# Patient Record
Sex: Female | Born: 1980 | Race: Black or African American | Hispanic: No | Marital: Single | Smoking: Never smoker
Health system: Southern US, Community
[De-identification: ages and names within clinical notes are randomized; demographics above are authoritative.]

## PROBLEM LIST (undated history)

## (undated) DIAGNOSIS — N6310 Unspecified lump in the right breast, unspecified quadrant: Secondary | ICD-10-CM

## (undated) DIAGNOSIS — J45909 Unspecified asthma, uncomplicated: Secondary | ICD-10-CM

## (undated) DIAGNOSIS — Z8719 Personal history of other diseases of the digestive system: Secondary | ICD-10-CM

## (undated) DIAGNOSIS — F988 Other specified behavioral and emotional disorders with onset usually occurring in childhood and adolescence: Secondary | ICD-10-CM

## (undated) DIAGNOSIS — Z8041 Family history of malignant neoplasm of ovary: Secondary | ICD-10-CM

## (undated) DIAGNOSIS — L709 Acne, unspecified: Secondary | ICD-10-CM

## (undated) DIAGNOSIS — N926 Irregular menstruation, unspecified: Secondary | ICD-10-CM

## (undated) DIAGNOSIS — R011 Cardiac murmur, unspecified: Secondary | ICD-10-CM

## (undated) DIAGNOSIS — K589 Irritable bowel syndrome without diarrhea: Secondary | ICD-10-CM

## (undated) HISTORY — DX: Family history of malignant neoplasm of ovary: Z80.41

## (undated) HISTORY — DX: Cardiac murmur, unspecified: R01.1

## (undated) HISTORY — DX: Unspecified asthma, uncomplicated: J45.909

## (undated) HISTORY — PX: BREAST ENHANCEMENT SURGERY: SHX7

## (undated) HISTORY — DX: Other specified behavioral and emotional disorders with onset usually occurring in childhood and adolescence: F98.8

## (undated) HISTORY — DX: Personal history of other diseases of the digestive system: Z87.19

## (undated) HISTORY — DX: Irritable bowel syndrome, unspecified: K58.9

---

## 2000-01-20 HISTORY — PX: KNEE SURGERY: SHX244

## 2002-09-25 ENCOUNTER — Ambulatory Visit: Admission: RE | Admit: 2002-09-25 | Discharge: 2002-10-19 | Payer: Self-pay | Admitting: Radiation Oncology

## 2003-07-24 ENCOUNTER — Encounter: Admission: RE | Admit: 2003-07-24 | Discharge: 2003-07-24 | Payer: Self-pay | Admitting: Family Medicine

## 2003-09-02 ENCOUNTER — Other Ambulatory Visit: Admission: RE | Admit: 2003-09-02 | Discharge: 2003-09-02 | Payer: Self-pay | Admitting: Obstetrics and Gynecology

## 2003-09-02 ENCOUNTER — Encounter: Admission: RE | Admit: 2003-09-02 | Discharge: 2003-09-02 | Payer: Self-pay | Admitting: Obstetrics and Gynecology

## 2003-09-02 ENCOUNTER — Encounter (INDEPENDENT_AMBULATORY_CARE_PROVIDER_SITE_OTHER): Payer: Self-pay

## 2003-09-16 ENCOUNTER — Encounter: Admission: RE | Admit: 2003-09-16 | Discharge: 2003-09-16 | Payer: Self-pay | Admitting: Obstetrics and Gynecology

## 2004-06-22 ENCOUNTER — Encounter: Admission: RE | Admit: 2004-06-22 | Discharge: 2004-06-22 | Payer: Self-pay | Admitting: Obstetrics and Gynecology

## 2004-07-13 ENCOUNTER — Encounter: Admission: RE | Admit: 2004-07-13 | Discharge: 2004-07-13 | Payer: Self-pay | Admitting: Obstetrics and Gynecology

## 2004-07-13 ENCOUNTER — Encounter (INDEPENDENT_AMBULATORY_CARE_PROVIDER_SITE_OTHER): Payer: Self-pay | Admitting: Specialist

## 2004-08-10 ENCOUNTER — Encounter: Admission: RE | Admit: 2004-08-10 | Discharge: 2004-08-10 | Payer: Self-pay | Admitting: Obstetrics and Gynecology

## 2004-08-30 ENCOUNTER — Ambulatory Visit: Payer: Self-pay | Admitting: Obstetrics and Gynecology

## 2004-08-30 ENCOUNTER — Ambulatory Visit (HOSPITAL_COMMUNITY): Admission: RE | Admit: 2004-08-30 | Discharge: 2004-08-30 | Payer: Self-pay | Admitting: *Deleted

## 2004-08-30 ENCOUNTER — Encounter (INDEPENDENT_AMBULATORY_CARE_PROVIDER_SITE_OTHER): Payer: Self-pay | Admitting: Specialist

## 2004-08-30 HISTORY — PX: DILATION AND CURETTAGE OF UTERUS: SHX78

## 2004-08-31 ENCOUNTER — Ambulatory Visit: Payer: Self-pay | Admitting: Obstetrics and Gynecology

## 2004-10-12 ENCOUNTER — Other Ambulatory Visit: Admission: RE | Admit: 2004-10-12 | Discharge: 2004-10-12 | Payer: Self-pay | Admitting: Obstetrics and Gynecology

## 2004-10-12 ENCOUNTER — Ambulatory Visit: Payer: Self-pay | Admitting: Obstetrics and Gynecology

## 2005-04-11 ENCOUNTER — Ambulatory Visit (HOSPITAL_COMMUNITY): Admission: RE | Admit: 2005-04-11 | Discharge: 2005-04-11 | Payer: Self-pay | Admitting: Obstetrics & Gynecology

## 2005-05-23 ENCOUNTER — Ambulatory Visit (HOSPITAL_COMMUNITY): Admission: RE | Admit: 2005-05-23 | Discharge: 2005-05-23 | Payer: Self-pay | Admitting: Obstetrics & Gynecology

## 2005-08-29 ENCOUNTER — Inpatient Hospital Stay (HOSPITAL_COMMUNITY): Admission: AD | Admit: 2005-08-29 | Discharge: 2005-08-29 | Payer: Self-pay | Admitting: Obstetrics

## 2005-09-30 ENCOUNTER — Inpatient Hospital Stay (HOSPITAL_COMMUNITY): Admission: AD | Admit: 2005-09-30 | Discharge: 2005-10-01 | Payer: Self-pay | Admitting: Obstetrics

## 2005-10-03 ENCOUNTER — Inpatient Hospital Stay (HOSPITAL_COMMUNITY): Admission: AD | Admit: 2005-10-03 | Discharge: 2005-10-07 | Payer: Self-pay | Admitting: Obstetrics

## 2005-10-04 ENCOUNTER — Encounter (INDEPENDENT_AMBULATORY_CARE_PROVIDER_SITE_OTHER): Payer: Self-pay | Admitting: Specialist

## 2005-10-24 ENCOUNTER — Ambulatory Visit: Admission: RE | Admit: 2005-10-24 | Discharge: 2005-10-24 | Payer: Self-pay | Admitting: Obstetrics

## 2007-03-16 IMAGING — US US OB COMP LESS 14 WK
1 series · 13 of 28 positions shown · non-contrast
Comparison: none

CLINICAL DATA: Uncertain gestational age.  Assess viability.  
 EARLY OBSTETRICAL ULTRASOUND WITH TRANSVAGINAL:
 Multiple images of the uterus and adnexa were obtained using a transabdominal and endovaginal approaches. 
 There is a single intrauterine pregnancy identified that demonstrates an estimated gestational age by crown rump length of 11 weeks and 0 days.  Positive regular fetal cardiac activity with a rate of 150 bpm was noted.   A normal appearing yolk sac and amnion are noted.  A small old subchorionic hemorrhage is seen.
 Both ovaries are seen with the left ovary measuring 3.7 x 1.6 x 2.4 cm and having a normal appearance.  The right ovary measures 5.0 x 7.4 x 4.0 cm and contains two unilocular simple cysts measuring 3.4 x 3.8 x 3.6 cm and 2.4 x 3.4 x 3.6 cm. At least one of these likely represents a corpus luteum cyst.  Evaluation for concurrent ectopic pregnancy in the right adnexa was unremarkable given the presence of the two cystic areas in the right ovary.  These simple cysts can be reassessed at the anatomy follow-up.

[Series 1: us ob comp less 14 wk · 0.32mm/px · 13 of 54 slices shown]
[im 2/54]
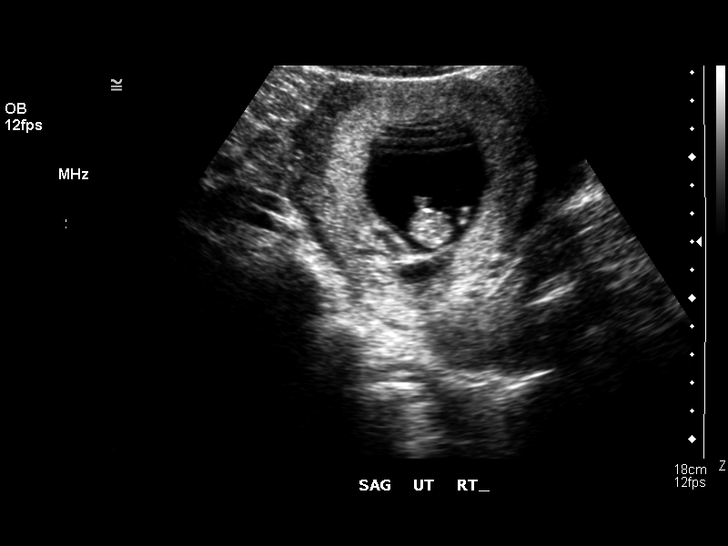
[im 6/54]
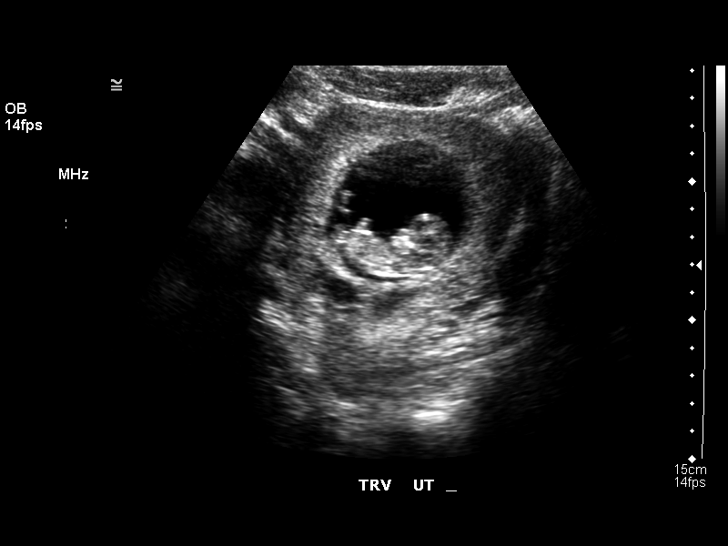
[im 10/54]
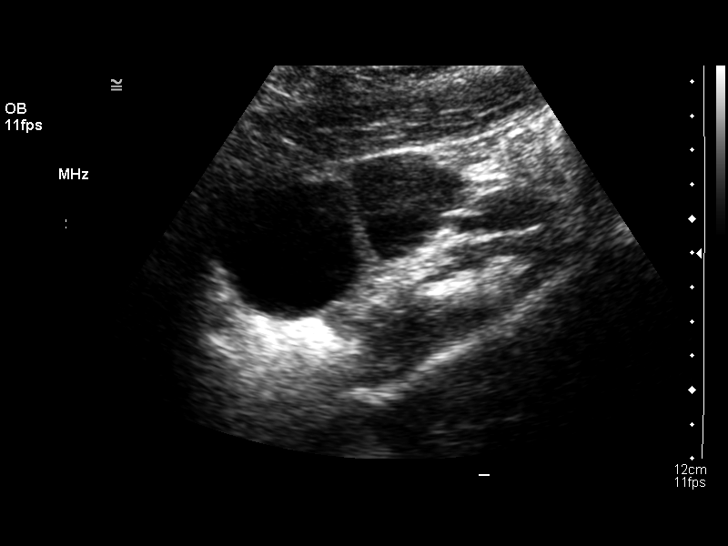
[im 14/54]
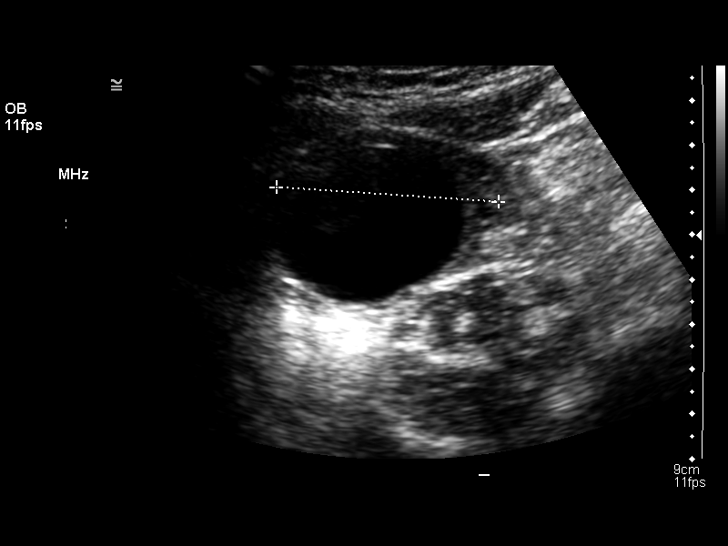
[im 18/54]
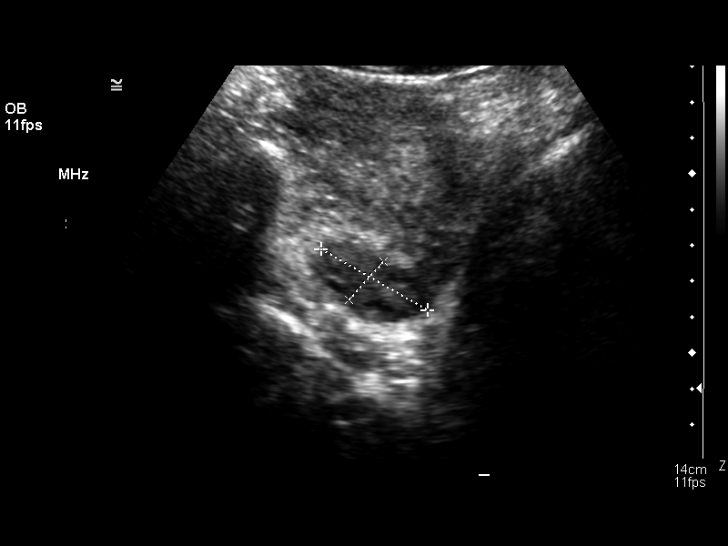
[im 22/54]
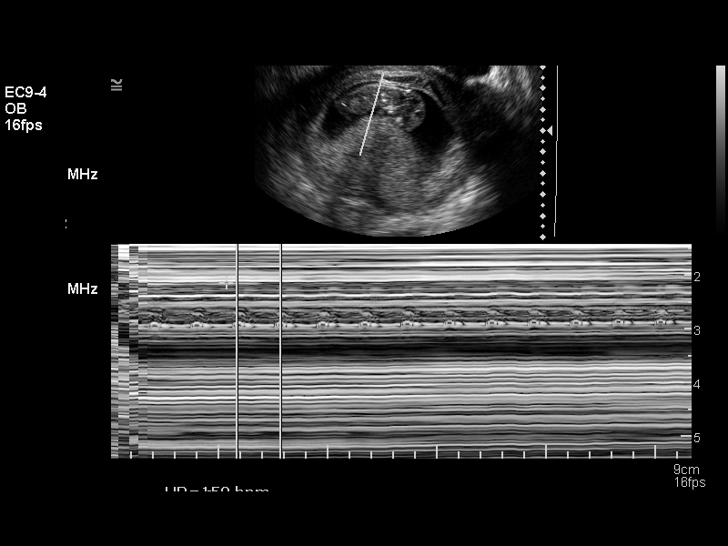
[im 28/54]
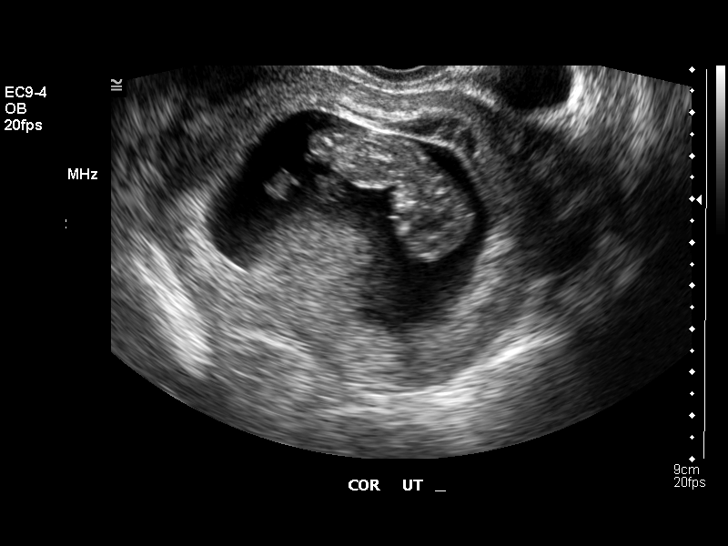
[im 32/54]
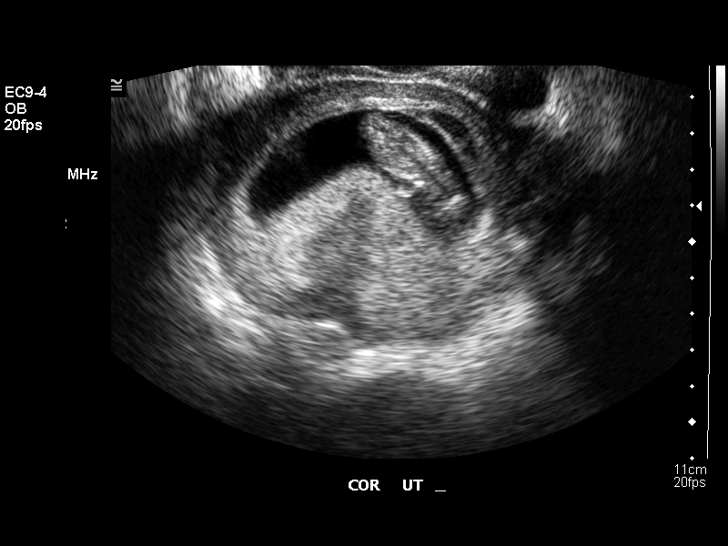
[im 36/54]
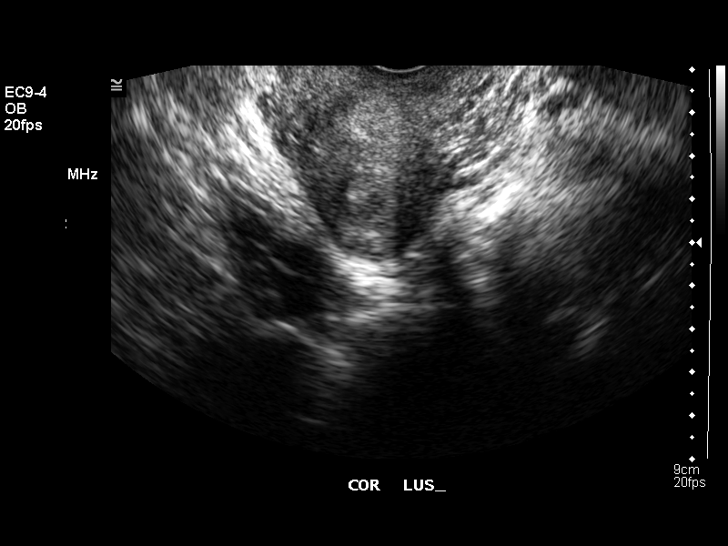
[im 40/54]
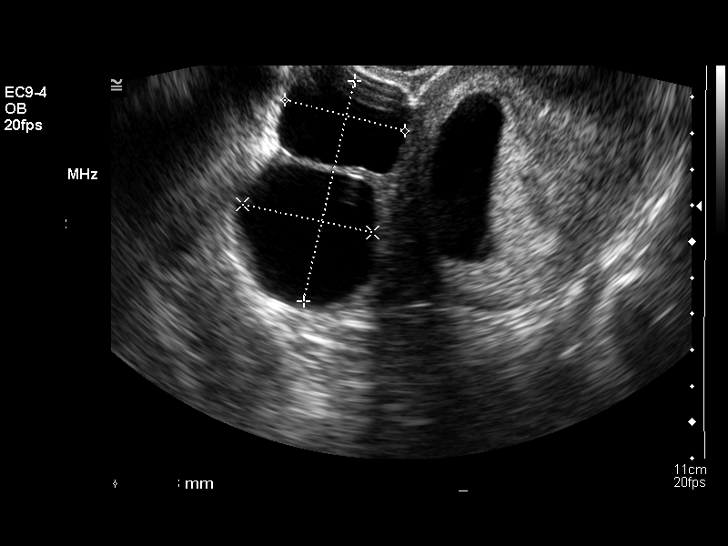
[im 44/54]
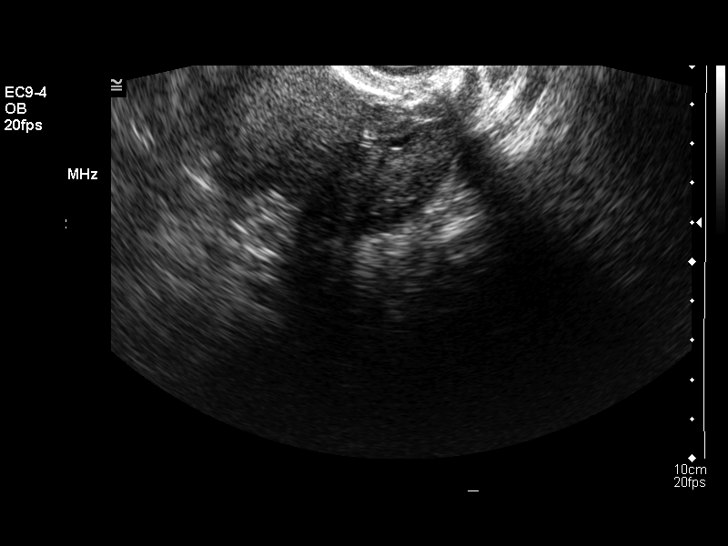
[im 48/54]
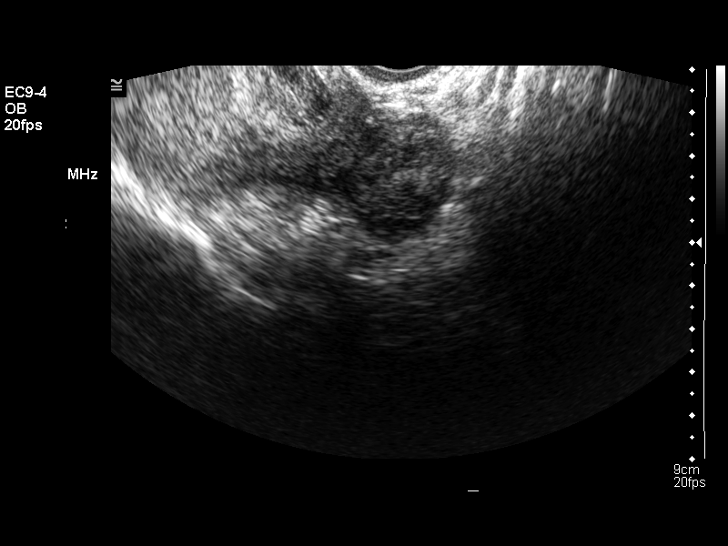
[im 52/54]
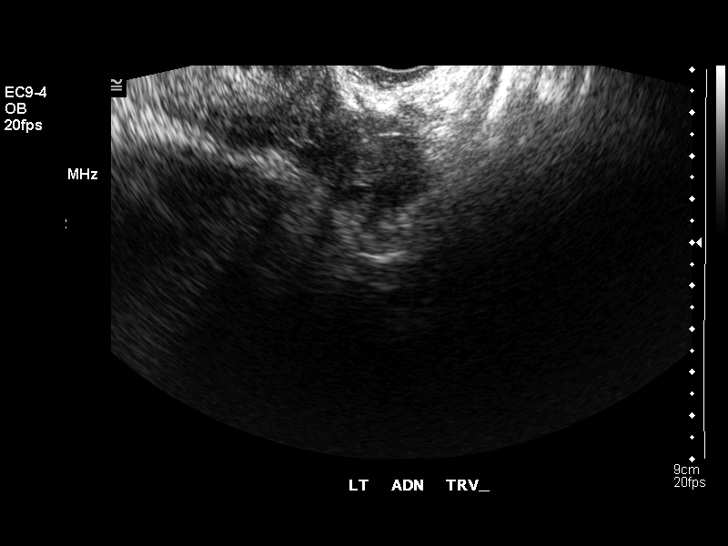

[13 of 28 positions shown; findings below may reference images not displayed]

IMPRESSION: 1.  11 week 0 day living intrauterine pregnancy.  Small old subchorionic hemorrhage.
 2.    Normal left ovary.  
 3.  Two simple right ovarian cysts.

## 2007-05-11 ENCOUNTER — Encounter: Admission: RE | Admit: 2007-05-11 | Discharge: 2007-05-11 | Payer: Self-pay | Admitting: Obstetrics and Gynecology

## 2007-11-30 ENCOUNTER — Encounter: Admission: RE | Admit: 2007-11-30 | Discharge: 2007-11-30 | Payer: Self-pay | Admitting: Obstetrics and Gynecology

## 2008-06-09 ENCOUNTER — Encounter: Admission: RE | Admit: 2008-06-09 | Discharge: 2008-06-09 | Payer: Self-pay

## 2008-08-15 ENCOUNTER — Encounter: Admission: RE | Admit: 2008-08-15 | Discharge: 2008-08-15 | Payer: Self-pay | Admitting: Obstetrics and Gynecology

## 2009-02-13 ENCOUNTER — Ambulatory Visit: Payer: Self-pay | Admitting: Gastroenterology

## 2009-02-13 DIAGNOSIS — R110 Nausea: Secondary | ICD-10-CM | POA: Insufficient documentation

## 2009-02-13 DIAGNOSIS — R198 Other specified symptoms and signs involving the digestive system and abdomen: Secondary | ICD-10-CM

## 2009-02-13 DIAGNOSIS — K5900 Constipation, unspecified: Secondary | ICD-10-CM | POA: Insufficient documentation

## 2009-02-13 DIAGNOSIS — R109 Unspecified abdominal pain: Secondary | ICD-10-CM | POA: Insufficient documentation

## 2009-02-13 DIAGNOSIS — R197 Diarrhea, unspecified: Secondary | ICD-10-CM

## 2009-02-13 LAB — CONVERTED CEMR LAB
ALT: 13 units/L (ref 0–35)
AST: 17 units/L (ref 0–37)
Albumin: 4.2 g/dL (ref 3.5–5.2)
Alkaline Phosphatase: 58 units/L (ref 39–117)
BUN: 9 mg/dL (ref 6–23)
Basophils Absolute: 0 10*3/uL (ref 0.0–0.1)
Basophils Relative: 0.4 % (ref 0.0–3.0)
CO2: 29 meq/L (ref 19–32)
CRP, High Sensitivity: <1 — ABNORMAL LOW (ref 0.00–5.00)
Calcium: 9.5 mg/dL (ref 8.4–10.5)
Chloride: 105 meq/L (ref 96–112)
Creatinine, Ser: 0.8 mg/dL (ref 0.4–1.2)
Eosinophils Absolute: 0.1 10*3/uL (ref 0.0–0.7)
Eosinophils Relative: 0.7 % (ref 0.0–5.0)
GFR calc Af Amer: 111 mL/min
GFR calc non Af Amer: 91 mL/min
Glucose, Bld: 94 mg/dL (ref 70–99)
HCT: 36 % (ref 36.0–46.0)
Hemoglobin: 12 g/dL (ref 12.0–15.0)
Lymphocytes Relative: 24 % (ref 12.0–46.0)
MCHC: 33.4 g/dL (ref 30.0–36.0)
MCV: 89.4 fL (ref 78.0–100.0)
Monocytes Absolute: 0.6 10*3/uL (ref 0.1–1.0)
Monocytes Relative: 6.1 % (ref 3.0–12.0)
Neutro Abs: 6.6 10*3/uL (ref 1.4–7.7)
Neutrophils Relative %: 68.8 % (ref 43.0–77.0)
Platelets: 223 10*3/uL (ref 150–400)
Potassium: 3.6 meq/L (ref 3.5–5.1)
RBC: 4.03 M/uL (ref 3.87–5.11)
RDW: 12.3 % (ref 11.5–14.6)
Sodium: 139 meq/L (ref 135–145)
Total Bilirubin: 0.9 mg/dL (ref 0.3–1.2)
Total Protein: 6.9 g/dL (ref 6.0–8.3)
WBC: 9.6 10*3/uL (ref 4.5–10.5)

## 2009-02-16 ENCOUNTER — Telehealth: Payer: Self-pay | Admitting: Gastroenterology

## 2009-02-17 ENCOUNTER — Ambulatory Visit: Payer: Self-pay | Admitting: Gastroenterology

## 2009-02-18 ENCOUNTER — Telehealth: Payer: Self-pay | Admitting: Gastroenterology

## 2009-02-25 ENCOUNTER — Telehealth: Payer: Self-pay | Admitting: Gastroenterology

## 2009-02-25 ENCOUNTER — Encounter: Payer: Self-pay | Admitting: Gastroenterology

## 2009-03-17 ENCOUNTER — Encounter: Payer: Self-pay | Admitting: Gastroenterology

## 2009-03-24 ENCOUNTER — Ambulatory Visit: Payer: Self-pay | Admitting: Gastroenterology

## 2009-04-02 ENCOUNTER — Ambulatory Visit: Payer: Self-pay | Admitting: Gastroenterology

## 2009-04-02 LAB — CONVERTED CEMR LAB
Fecal Occult Blood: NEGATIVE
OCCULT 1: NEGATIVE
OCCULT 2: NEGATIVE
OCCULT 3: NEGATIVE
OCCULT 4: NEGATIVE
OCCULT 5: NEGATIVE

## 2009-04-12 ENCOUNTER — Encounter: Payer: Self-pay | Admitting: Gastroenterology

## 2009-04-24 ENCOUNTER — Encounter: Admission: RE | Admit: 2009-04-24 | Discharge: 2009-04-24 | Payer: Self-pay | Admitting: General Surgery

## 2010-04-26 ENCOUNTER — Ambulatory Visit: Payer: Self-pay | Admitting: Diagnostic Radiology

## 2010-04-26 ENCOUNTER — Emergency Department (HOSPITAL_BASED_OUTPATIENT_CLINIC_OR_DEPARTMENT_OTHER): Admission: EM | Admit: 2010-04-26 | Discharge: 2010-04-26 | Payer: Self-pay | Admitting: Emergency Medicine

## 2011-01-09 ENCOUNTER — Encounter: Payer: Self-pay | Admitting: Obstetrics and Gynecology

## 2011-05-06 NOTE — Discharge Summary (Signed)
Crystal Stuart, Crystal Stuart              ACCOUNT NO.:  [id number]   MEDICAL RECORD NO.:  [id number]          PATIENT TYPE:  INP   LOCATION:  9152                          [facility]   PHYSICIAN:  Charles A. Clearance Coots, M.D.DATE OF BIRTH:  1981/03/12   DATE OF ADMISSION:  09/30/2005  DATE OF DISCHARGE:  10/01/2005                                 DISCHARGE SUMMARY   ADMITTING DIAGNOSES:  1.  Intrauterine pregnancy at [redacted] weeks gestation.  2.  Mild preeclampsia.   DISCHARGE DIAGNOSES:  1.  Intrauterine pregnancy at [redacted] weeks gestation.  2.  Mild preeclampsia.  3.  Discharged home undelivered at 35+ weeks gestation, much improved, in      good condition.   REASON FOR ADMISSION:  A 30 year old G2, P0, estimated date of confinement  of October 31, 2005 admitted at [redacted] weeks gestation with mild preeclampsia.  Patient had presented to the office on October 12 for routine prenatal visit  and blood pressure was 139/75.  The patient returned to the office on the  day of admission.  Blood pressure 148/74.  There was no proteinuria.   PAST MEDICAL HISTORY:   SURGERY:  Left knee surgery.   ILLNESSES:  None.   MEDICATIONS:  Prenatal vitamins.   ALLERGIES:  No known drug allergies.   SOCIAL HISTORY:  Employed as a Education administrator.  She is single.  Negative history of tobacco, alcohol, or recreational drug use.   PHYSICAL EXAMINATION:  VITAL SIGNS:  Blood pressure 148/74.  GENERAL:  Well-developed, well-nourished female in no acute distress.  HEENT:  Normal.  LUNGS:  Clear to auscultation bilaterally.  ABDOMEN:  Gravid, nontender.  PELVIC:  Deferred.  EXTREMITIES:  Lower extremities with 1-2+ lower extremity edema.   HOSPITAL COURSE:  Patient was admitted and 24-hour urine was started.  24-  hour urine was within normal limits with a total protein of 149 mg for 24  hours.  The patient's blood pressures normalized on bed rest and she was  discharged home with normal blood  pressures on hospital day #4.   DISCHARGE DISPOSITION:   MEDICATIONS:  Continue prenatal vitamins.  Modified bed rest.  Patient is to  call the office for a follow-up appointment.      Charles A. Clearance Coots, M.D.  Electronically Signed     CAH/MEDQ  D:  11/11/2005  T:  11/11/2005  Job:  [id number] [id number]

## 2011-05-06 NOTE — Group Therapy Note (Signed)
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  The patient had a LEEP procedure done in September,  2004, for severe dysplasia and the pathology showed focal moderate dysplasia  involving the endocervical portion of the specimen.  At that time the  squamocolumnar junction was not well seen.  The majority of the specimen  showed possible dysplasia was present.  The patient did not return for her  followup Pap smear in four months as instructed and had her first one in  March of this year which was reported as above.  So, today she was brought  in for colposcopy and when we did this there was a pinpoint cervical opening  with a very small amount of bleeding, but it was impossible even to put the  little pap brush into that opening.  She has since the LEEP been on Depo-  Provera and when she has had any bleeding she has had severe cramps  associated with it.  My impression is that this patient has significant  cervical stenosis which is probably causing her cramping.  Since she has had  unprotected intercourse over the months prior since her last LEEP procedure,  it is impossible to say whether this is a new infection or a recurrence or  progression of previous on colposcopy, although this is unsatisfactory  because the squamocolumnar junction could not be visualized.  Outside of  that there was some acetowhite areas that faded very quickly.  My  inclination is to probably take this patient to the operating  room, dilate  her up and place in a laminaria that may allow redilatation of the cervical  stenosis and then follow that up with a colposcopy that perhaps can be  satisfactory.   IMPRESSION:  Atypical Papanicolaou smear, low-grade squamous intraepithelial  lesion which is suspicious for high-grade squamous intraepithelial lesion,  and cervical stenosis.                                               Argentina Donovan, MD    PR/MEDQ  D:  07/13/2004  T:  07/13/2004  Job:  [id number]

## 2011-05-06 NOTE — Op Note (Signed)
Crystal Stuart, ELICKER                          ACCOUNT NO.:  [id number]   MEDICAL RECORD NO.:  [id number]                   PATIENT TYPE:  AMB   LOCATION:  [facility]                                  [facility]   PHYSICIAN:  Phil D. Okey Dupre, M.D.                  DATE OF BIRTH:  12/08/1981   DATE OF PROCEDURE:  08/30/2004  DATE OF DISCHARGE:                                 OPERATIVE REPORT   PREOPERATIVE DIAGNOSIS:  Severe cervical stenosis and abnormal Pap smear.   POSTOPERATIVE DIAGNOSIS:  Severe cervical stenosis and abnormal Pap smear.   OPERATION PERFORMED:  Cervical dilation, endocervical curettage, insertion  of laminaria.   SURGEON:  Javier Glazier. Okey Dupre, M.D.   ANESTHESIA:  General.   ESTIMATED BLOOD LOSS:  Less than 5 mL.   POSTOPERATIVE CONDITION:  Satisfactory.   INDICATIONS FOR PROCEDURE:  The patient about one year ago had a LEEP for  severe cervical dysplasia.  Follow-up Pap smear showed  possible recurrence  and the cervix was so stenotic that a small cervical brush could not be  entered in order to get a Pap smear, nor could the transition zone be seen  to do an adequate colposcopy.  Operative findings was a marked cervical  stenosis that had to be started up with the dilation using ophthalmological  dilators.   DESCRIPTION OF PROCEDURE:  Under satisfactory general anesthesia, the  patient in dorsal lithotomy position, the perineum and vagina were prepped  and draped in the usual sterile manner.  External genitalia was normal.  BUS  within normal limits.  Vagina clean and well rugated.  The cervix was clean  and absolutely stenotic.  A small indentation could be seen where the cervix  os should be.  The uterus was anterior, normal size, shape, consistency and  normal adnexa.  Using the small ophthalmological dilator, I was able to get  through the stenosis and into the uterine cavity.  After several graduated  ophthalmological dilators, a #1 Hegar dilator was able to be  used.  I was  able to get this eventually up to a #7 which was left in place for  approximately 4 minutes.  At the end of that time, I removed it, did an  endocervical curettage and inserted two thick laminaria to the hilt.  Two  folded 4 x 4s were then placed in to hold in the laminaria which I plan on  taking out in 24 hours.  The patient tolerated the procedure well, was  transferred to recovery room in satisfactory condition. Minimal blood loss.  The plan is to see the patient, remove the laminaria tomorrow and then do an  ectocervix colposcopy since we now have an endocervical curettage done.  Phil D. Okey Dupre, M.D.    PDR/MEDQ  D:  08/30/2004  T:  08/30/2004  Job:  [id number]

## 2011-05-06 NOTE — Group Therapy Note (Signed)
NAMEAMIEL, SHARROW NO.:  [id number]   MEDICAL RECORD NO.:  [id number]                   PATIENT TYPE:  OUT   LOCATION:  [facility]                           FACILITY:  [facility]   PHYSICIAN:  Argentina Donovan, MD                     DATE OF BIRTH:  [birth year]   DATE OF SERVICE:  08/10/2004                                    CLINIC NOTE   REASON FOR VISIT:  This is a patient with moderate squamous dysplasia on  cervical LEEP done a year ago.  She has developed significant cervical  stenosis which makes it too small even to insert a little Pap brush nor the  smallest laminaria that we could obtain, and could not sound it with a  uterine sound.  Therefore, we are going to admit the patient to the  operating room for some sedation, local anesthesia, and then hopefully be  able to dilate her and place a large laminaria in for a day or so so that we  can do an appropriate colposcopy exam if necessary following that.  We will  do the colposcopy while she is in the OR.   DIAGNOSIS:  Cervical dysplasia postoperatively.                                               Argentina Donovan, MD    PR/MEDQ  D:  08/10/2004  T:  08/10/2004  Job:  [id number]

## 2011-05-06 NOTE — Op Note (Signed)
Crystal Stuart, Crystal Stuart              ACCOUNT NO.:  [id number]   MEDICAL RECORD NO.:  [id number]          PATIENT TYPE:  INP   LOCATION:  9111                          [facility]   PHYSICIAN:  Charles A. Clearance Coots, M.D.DATE OF BIRTH:  Mar 01, 1981   DATE OF PROCEDURE:  10/04/2005  DATE OF DISCHARGE:                                 OPERATIVE REPORT   PREOPERATIVE DIAGNOSES:  Prolonged fetal heart rate decelerations  (bradycardia), [redacted] weeks gestation, with preeclampsia.   POSTOPERATIVE DIAGNOSES:  Prolonged fetal heart rate decelerations  (bradycardia), [redacted] weeks gestation, with preeclampsia.   PROCEDURE:  Primary low transverse cesarean section.   SURGEON:  Charles A. Clearance Coots, M.D.   ASSISTANT:  Rich Brave, certified surgical technician.   ANESTHESIA:  Spinal.   ESTIMATED BLOOD LOSS:  700 mL.   IV FLUIDS:  650 mL.   URINE OUTPUT:  150 mL clear.   COMPLICATIONS:  None.   Foley to gravity.   FINDINGS:  Viable female at 21.  Apgars of 8 at one minute and 9 at five  minutes, weight of 5 pounds 14 ounces, cord pH of 7.34.  Normal uterus,  ovaries and fallopian tubes.   OPERATION:  The patient was brought to the operating room and after  satisfactory spinal anesthesia, the abdomen was prepped and draped in the  usual sterile fashion.  A Pfannenstiel skin incision was made with a  scalpel.  It was deepened down to the fascia with a scalpel.  The fascia was  nicked in the midline and the fascial incision was extended to left and to  the right with curved Mayo scissors.  The superior and inferior fascial  edges were taken off of the rectus muscles with both blunt and sharp  dissection.  The rectus muscle was bluntly and sharply divided in the  midline.  The peritoneum was entered digitally and was digitally extended to  the left and to the right.  The bladder blade was positioned.  The  vesicouterine fold of the peritoneum above the reflection of the urinary  bladder was  grasped with forceps and was incised with Metzenbaum scissors.  The incision was extended to the left and to the right with Metzenbaum  scissors after undermining.  The bladder flap was bluntly developed and  bladder blade was repositioned in front of the urinary bladder, placing it  well out of the operative field.  The uterus was then entered transversely  in the lower uterine segment with the scalpel.  Clear amniotic fluid was  expelled.  The uterine incision was extended to left and to the right  digitally.  The vertex was then delivered with the aid of fundal pressure  from the assistant.  The infant's mouth and nose were suctioned with a  suction bulb and delivery was completed with the aid of fundal pressure from  the assistant.  The umbilical cord was doubly clamped and cut and the infant  was handed off to the nursery staff.  Cord pH and cord blood were obtained  and the placenta was spontaneously expelled from the uterine cavity intact.  The  endometrial surface was thoroughly debrided with a dry lap sponge.  The  edges of the uterine incision were grasped with ring forceps.  The uterus  was closed in two layers.  The first layer was closed with continuous  interlocking suture of 0 Monocryl.  The second layer was closed with  continuous imbricating suture of 0 Monocryl.  Hemostasis was excellent.  The  pelvic cavity was thoroughly irrigated with warm saline solution and all  clots were removed.  The abdomen was then closed as follows:  Peritoneum was  closed with continuous suture of 2-0 Monocryl.  Fascia was closed with  continuous suture of 0 Vicryl.  Subcutaneous tissue was thoroughly irrigated  with warm saline solution and all areas of subcutaneous bleeding were  coagulated with the Bovie.  The skin was then closed with a continuous  subcuticular suture of 3-0 Monocryl.  A sterile bandage was applied to the  incision closure.  The surgical technician indicated that all needle,  sponge  and instrument counts were correct.  The patient tolerated procedure well,  was transported to the recovery room in satisfactory condition.      Charles A. Clearance Coots, M.D.  Electronically Signed     CAH/MEDQ  D:  10/04/2005  T:  10/04/2005  Job:  [id number]

## 2011-05-06 NOTE — Discharge Summary (Signed)
[name], Crystal Stuart              ACCOUNT NO.:  [id number]   MEDICAL RECORD NO.:  [id number]          PATIENT TYPE:  INP   LOCATION:  9116                          [facility]   PHYSICIAN:  Roseanna Rainbow, M.D.DATE OF BIRTH:  08-07-1981   DATE OF ADMISSION:  10/03/2005  DATE OF DISCHARGE:  10/07/2005                                 DISCHARGE SUMMARY   CHIEF COMPLAINT:  The patient is a 30 year old, gravida 2, para 0, with an  estimated date of confinement of October 31, 2005 with an intrauterine  pregnancy at 35+ weeks with mild preeclampsia.  Please see the dictated  history and physical for further details.   HOSPITAL COURSE:  The patient was admitted. The fetal heart tracing was  reviewed on October 16 and there were intermittent variable decelerations  noted. Subsequent to this, there was a prolonged deceleration in the 60-90  beats per minute range for nine minutes.  She continued to have several  episodes of prolonged decelerations. The cervix was not favorable after an  attempt to ripen with Cervidil.  At this point the decision was made to  proceed with a cesarean delivery.  Please see the dictated operative summary  as per Dr. Coral Ceo for further details.   Her postoperative course was uneventful.  Her blood pressures remained in  the normotensive range postoperatively.  She was discharged to home on  postoperative day #3 tolerating a regular diet.   DISCHARGE DIAGNOSES:  1.  Intrauterine pregnancy at 35+ weeks.  2.  Mild preeclampsia.  3.  Suspicious fetal heart tracing with repetitive prolonged decelerations.   OPERATION/PROCEDURE:  Cesarean delivery.   CONDITION ON DISCHARGE:  Stable.   DIET:  Regular.   ACTIVITY:  Pelvic rest, progressive activity.   DISCHARGE MEDICATIONS:  Percocet, ibuprofen, prenatal vitamins.   DISPOSITION:  The patient was to follow up in the office in two weeks.      Roseanna Rainbow, M.D.  Electronically  Signed     LAJ/MEDQ  D:  11/14/2005  T:  11/15/2005  Job:  [id number]

## 2011-05-06 NOTE — H&P (Signed)
NAMEYERALDI, FIDLER NO.:  [id number]   MEDICAL RECORD NO.:  [id number]          PATIENT TYPE:  MAT   LOCATION:  [facility]                          [facility]   PHYSICIAN:  Roseanna Rainbow, M.D.DATE OF BIRTH:  10-18-1981   DATE OF ADMISSION:  09/30/2005  DATE OF DISCHARGE:                                HISTORY & PHYSICAL   CHIEF COMPLAINT:  The patient is a 30 year old, gravida 2, para 0 with an  estimated date of confinement of October 31, 2005, with an intrauterine  pregnancy at 35.4 weeks with mild preeclampsia.   HISTORY OF PRESENT ILLNESS:  The patient had presented to the office on  October 12 for a routine prenatal visit, and her blood pressure was 139/75.  The patient returns today, and her blood pressure is 148/74.  There is no  proteinuria on urine dip stick.   Laboratory work-up is remarkable for a creatinine of 1 and a uric acid of  6.3.  A nonstress test in the office today was reactive.   PREGNANCY RISK FACTORS:  Sickle cell trait.   MEDICATIONS:  Prenatal vitamins.   ALLERGIES:  No known drug allergies.   PRENATAL SCREENS:  Antibody screen negative, blood type O positive,  hemoglobin 11.7, hematocrit 34.5, platelets 190,000.  Quad screen within  normal limits, RPR nonreactive, rubella immune, sickle cell positive.  Urine  culture and sensitivity no growth.   PAST OB/GYN HISTORY:  Cervical dysplasia.  She is status post a LEEP  procedure in April 2005, complicated by postprocedure cervical stenosis that  required dilatation in September 2005.  She has had 1 miscarriage or  abortion.   PAST MEDICAL HISTORY:  No significant history of medical diseases.   PAST SURGICAL HISTORY:  Left knee surgery.   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  She is single, does not give  any significant history of alcohol usage, has no significant smoking  history, denies illicit drug use.   FAMILY ILLNESSES:  No major illnesses known.   PHYSICAL EXAMINATION:  VITAL SIGNS:  Blood pressure 148/74, nonstress test  reactive.  GENERAL:  Well-developed, well-nourished, no apparent distress.  HEAD/EYES/EARS/NOSE/THROAT:  There is facial edema.  ABDOMEN:  Gravid.  PELVIC EXAM:  Deferred.  LOWER EXTREMITIES:  1-2+ lower extremity edema.   ASSESSMENT:  1.  Intrauterine pregnancy at 35+ weeks.  2.  Mild preeclampsia.   PLAN:  1.  Admission.  2.  Heightened fetal surveillance.  3.  Complete obstetric ultrasound.  4.  Serial laboratory work.  5.  Daily weights.  6.  Twenty-four hour urine for protein and creatinine.      Roseanna Rainbow, M.D.  Electronically Signed     LAJ/MEDQ  D:  09/30/2005  T:  09/30/2005  Job:  [id number]

## 2011-05-06 NOTE — Group Therapy Note (Signed)
   NAMEJELINA, Crystal Stuart NO.:  [id number]   MEDICAL RECORD NO.:  [id number]                   PATIENT TYPE:  OUT   LOCATION:  [facility]                           FACILITY[facility]   PHYSICIAN:  Argentina Donovan, MD                     DATE OF BIRTH:  03-Sep-1981   DATE OF SERVICE:  07/24/2003                                    CLINIC NOTE   HISTORY OF PRESENT ILLNESS:  The patient is a 30 year old nulligravida black  female who was referred from the [facility] for a LEEP after  colposcopy showed only a slight dysplasia on the exocervix, but features  consistent with high grade squamous intraepithelial lesion on endocervical  curettage.  The patient was examined and found to have a very small, clean  cervix which is appropriate for a clinic LEEP procedure.  We discussed with  the patient the possible side effects and risks and she will be watching the  LEEP film before she leaves.  She also has been complaining of some  dyspareunia both on entry and deep penetration and we reviewed the possible  causes of that and the way that she might alleviate those problems.  She is  on Depo-Provera, has been for quite a few years, apparently, and has been  doing well with those except for some mild headaches.   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   PLAN:  LEEP biopsy in the clinic.                                               Argentina Donovan, MD    PR/MEDQ  D:  07/24/2003  T:  07/24/2003  Job:  [id number]

## 2012-03-28 ENCOUNTER — Other Ambulatory Visit: Payer: Self-pay | Admitting: Obstetrics and Gynecology

## 2012-03-28 DIAGNOSIS — L709 Acne, unspecified: Secondary | ICD-10-CM

## 2012-03-28 DIAGNOSIS — E569 Vitamin deficiency, unspecified: Secondary | ICD-10-CM

## 2012-08-14 ENCOUNTER — Other Ambulatory Visit: Payer: Self-pay | Admitting: Obstetrics and Gynecology

## 2012-08-14 ENCOUNTER — Telehealth: Payer: Self-pay | Admitting: Obstetrics and Gynecology

## 2012-08-14 DIAGNOSIS — N644 Mastodynia: Secondary | ICD-10-CM

## 2012-08-14 DIAGNOSIS — N6019 Diffuse cystic mastopathy of unspecified breast: Secondary | ICD-10-CM

## 2012-08-14 NOTE — Telephone Encounter (Signed)
Call from patient with a history of right breast pain and a palpable fatty versus lipomatous mass in lateral aspect of breast. Was evaluated several years ago with serial ultrasounds and consult with a general surgeon.  Patient opted at that time to forego a biopsy but now, for the past six days has an achy intense "soreness" in the medial upper aspect of right breast.  LMP 07/25/12 and is not using hormonal contraception or caffiene.  Patient is very anxious.  Denies any nipple discharge, skin changes or discreet palpable mass of the area currently in question (she is a Publishing rights manager).  [facility]. Mykelti Goldenstein, PA-C

## 2012-08-17 ENCOUNTER — Ambulatory Visit
Admission: RE | Admit: 2012-08-17 | Discharge: 2012-08-17 | Disposition: A | Discharge status: Home / Self Care | Payer: BC Managed Care – PPO | Source: Ambulatory Visit | Attending: Obstetrics and Gynecology | Admitting: Obstetrics and Gynecology

## 2012-08-17 ENCOUNTER — Other Ambulatory Visit: Payer: Self-pay | Admitting: Obstetrics and Gynecology

## 2012-08-17 DIAGNOSIS — N644 Mastodynia: Secondary | ICD-10-CM

## 2012-08-17 DIAGNOSIS — N6019 Diffuse cystic mastopathy of unspecified breast: Secondary | ICD-10-CM

## 2014-02-06 ENCOUNTER — Other Ambulatory Visit: Payer: Self-pay | Admitting: Specialist

## 2014-02-06 ENCOUNTER — Ambulatory Visit
Admission: RE | Admit: 2014-02-06 | Discharge: 2014-02-06 | Disposition: A | Discharge status: Home / Self Care | Payer: No Typology Code available for payment source | Source: Ambulatory Visit | Attending: Specialist | Admitting: Specialist

## 2014-02-06 DIAGNOSIS — R519 Headache, unspecified: Secondary | ICD-10-CM

## 2014-02-06 DIAGNOSIS — R51 Headache: Principal | ICD-10-CM

## 2014-02-14 ENCOUNTER — Other Ambulatory Visit (INDEPENDENT_AMBULATORY_CARE_PROVIDER_SITE_OTHER): Payer: Self-pay | Admitting: General Surgery

## 2014-02-14 ENCOUNTER — Encounter (INDEPENDENT_AMBULATORY_CARE_PROVIDER_SITE_OTHER): Payer: Self-pay | Admitting: General Surgery

## 2014-02-14 ENCOUNTER — Ambulatory Visit (INDEPENDENT_AMBULATORY_CARE_PROVIDER_SITE_OTHER): Payer: BC Managed Care – PPO | Admitting: General Surgery

## 2014-02-14 DIAGNOSIS — N63 Unspecified lump in unspecified breast: Secondary | ICD-10-CM

## 2014-02-14 DIAGNOSIS — N6310 Unspecified lump in the right breast, unspecified quadrant: Secondary | ICD-10-CM | POA: Insufficient documentation

## 2014-02-14 NOTE — Assessment & Plan Note (Signed)
I think this mass probably represent normal/fibrocystic breast tissue. It has not changed in the last 3 years. However, it is more prominent and firm than the surrounding breast tissue. It also feels discrete.  Because this is a palpable abnormality, we are going to remove this.  I discussed the procedure with the patient. Advised her that we'll make an incision over the mass and remove it. We will close this with dissolvable sutures. I discussed with the patient that she would not be able to shower for 48 hours and could not do any heavy lifting or strenuous activity for a week. I reviewed not to take blood thinners the week before surgery. We discussed the risk of bleeding, infection, and pain. 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  I am going to get new imaging on the patient, as she has not had any imaging for 2 years.  Again I think it is unlikely that this represents a malignancy, but if they did see a palpable abnormality, we would be able to get a needle biopsy of this.  I also do not think that she can undergo simultaneous breast augmentation without a pathologic diagnosis.

## 2014-02-14 NOTE — Patient Instructions (Signed)
IF YOU ARE TAKING ASPIRIN, COUMADIN/WARFARIN, PLAVIX, OR OTHER BLOOD THINNER, PLEASE LET US KNOW IMMEDIATELY.  WE WILL NEED TO DISCUSS WITH THE PRESCRIBING PROVIDER IF THESE ARE SAFE TO STOP. IF THESE ARE NOT STOPPED AT THE APPROPRIATE TIME, THIS WILL RESULT IN A DELAY FOR YOUR SURGERY.  DO NOT TAKE THESE MEDICATIONS OR IBUPROFEN/NAPROXEN WITHIN A WEEK BEFORE SURGERY.   The main risks of surgery are bleeding, infection, damage to other structures, and seroma (accumulation of fluid) under the incision site(s).    These complications may lead to additional procedures such as drainage of seroma/infection.  If cancer is found, you may need other surgeries to obtain negative margins or to take more lymph nodes.   Most women do accumulate fluid in the breast cavity where the specimen was removed. We do not always have to drain this fluid.  If your breast is very tense, painful, or red, then we may need to numb the skin and use a needle to aspirate the fluid.  We do provide patients with a Breast Binder.  The purpose of this is to avoid the use of tape on the sensitive tissue of the breast and to provide some compression to minimize the risk of seroma.  If the binder is uncomfortable, you may find that a tank top with a built-in shelf bra or a loose sports bra works better for you.  I recommend wearing this around the clock for the first 1-2 weeks except in the shower.    You may remove your dressings and may shower 48 hours after surgery.    Many patients have some constipation in the week after surgery due to the narcotics and anesthesia.  You may need over the counter stool softeners or laxatives if you experience difficulty having bowel movements.    If the following occur, call our office at [phone]: If you have a fever over 101 or pain that is severe despite narcotics. If you have redness or drainage at the wound. If you develop persistent nausea or vomiting.  I will follow you back up in  1-4 weeks.    Please submit any paperwork about time off work/insurance forms to the front desk.      
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
## 2014-02-17 ENCOUNTER — Telehealth (INDEPENDENT_AMBULATORY_CARE_PROVIDER_SITE_OTHER): Payer: Self-pay | Admitting: *Deleted

## 2014-02-17 NOTE — Telephone Encounter (Signed)
Called pt but voicemail is full so unable to leave VM.  I was calling to inform her of the appt for her MM and [place] at [facility] on 02/27/14 with an arrival time of 10:15am.

## 2014-02-17 NOTE — Telephone Encounter (Signed)
Patient called back and was given the below message.  Patient agreeable at this time.

## 2014-02-27 ENCOUNTER — Ambulatory Visit
Admission: RE | Admit: 2014-02-27 | Discharge: 2014-02-27 | Disposition: A | Discharge status: Home / Self Care | Payer: Self-pay | Source: Ambulatory Visit | Attending: General Surgery | Admitting: General Surgery

## 2014-02-27 DIAGNOSIS — N6310 Unspecified lump in the right breast, unspecified quadrant: Secondary | ICD-10-CM

## 2014-03-05 ENCOUNTER — Encounter (HOSPITAL_BASED_OUTPATIENT_CLINIC_OR_DEPARTMENT_OTHER): Payer: Self-pay | Admitting: *Deleted

## 2014-03-07 ENCOUNTER — Telehealth (INDEPENDENT_AMBULATORY_CARE_PROVIDER_SITE_OTHER): Payer: Self-pay

## 2014-03-07 NOTE — Telephone Encounter (Signed)
Pt scheduled for breast surgery on 03/13/14.  She wanted to know how long she could wait if she decided to reschedule.  I told her 3 months.  If she waited longer we would need to see/examine her again before rescheduling her surgery.  Pt understood and will let us know what she decides to do.  Message sent to surgery scheduling.

## 2014-03-12 ENCOUNTER — Encounter (HOSPITAL_BASED_OUTPATIENT_CLINIC_OR_DEPARTMENT_OTHER): Payer: Self-pay | Admitting: *Deleted

## 2014-03-13 ENCOUNTER — Ambulatory Visit (HOSPITAL_BASED_OUTPATIENT_CLINIC_OR_DEPARTMENT_OTHER): Admission: RE | Admit: 2014-03-13 | Payer: BC Managed Care – PPO | Source: Ambulatory Visit | Admitting: General Surgery

## 2014-03-13 ENCOUNTER — Encounter (HOSPITAL_BASED_OUTPATIENT_CLINIC_OR_DEPARTMENT_OTHER): Admission: RE | Payer: Self-pay | Source: Ambulatory Visit

## 2014-03-13 SURGERY — BREAST BIOPSY
Anesthesia: Choice | Laterality: Right

## 2014-03-28 ENCOUNTER — Encounter (INDEPENDENT_AMBULATORY_CARE_PROVIDER_SITE_OTHER): Payer: BC Managed Care – PPO | Admitting: General Surgery

## 2014-04-18 DIAGNOSIS — N6310 Unspecified lump in the right breast, unspecified quadrant: Secondary | ICD-10-CM

## 2014-04-18 HISTORY — DX: Unspecified lump in the right breast, unspecified quadrant: N63.10

## 2014-04-29 ENCOUNTER — Encounter (HOSPITAL_BASED_OUTPATIENT_CLINIC_OR_DEPARTMENT_OTHER): Payer: Self-pay | Admitting: *Deleted

## 2014-04-29 NOTE — Pre-Procedure Instructions (Signed)
Last annual exam (03/20/2014) requested and received from [place]

## 2014-05-02 ENCOUNTER — Encounter (HOSPITAL_BASED_OUTPATIENT_CLINIC_OR_DEPARTMENT_OTHER)
Admission: RE | Disposition: A | Discharge status: Home / Self Care | Payer: Self-pay | Source: Ambulatory Visit | Attending: General Surgery

## 2014-05-02 ENCOUNTER — Ambulatory Visit (HOSPITAL_BASED_OUTPATIENT_CLINIC_OR_DEPARTMENT_OTHER): Payer: BC Managed Care – PPO | Admitting: Anesthesiology

## 2014-05-02 ENCOUNTER — Encounter (HOSPITAL_BASED_OUTPATIENT_CLINIC_OR_DEPARTMENT_OTHER): Payer: Self-pay

## 2014-05-02 ENCOUNTER — Encounter (HOSPITAL_BASED_OUTPATIENT_CLINIC_OR_DEPARTMENT_OTHER): Payer: BC Managed Care – PPO | Admitting: Anesthesiology

## 2014-05-02 ENCOUNTER — Ambulatory Visit (HOSPITAL_BASED_OUTPATIENT_CLINIC_OR_DEPARTMENT_OTHER)
Admission: RE | Admit: 2014-05-02 | Discharge: 2014-05-02 | Disposition: A | Discharge status: Home / Self Care | Payer: BC Managed Care – PPO | Source: Ambulatory Visit | Attending: General Surgery | Admitting: General Surgery

## 2014-05-02 DIAGNOSIS — J45909 Unspecified asthma, uncomplicated: Secondary | ICD-10-CM | POA: Insufficient documentation

## 2014-05-02 DIAGNOSIS — N6019 Diffuse cystic mastopathy of unspecified breast: Secondary | ICD-10-CM

## 2014-05-02 DIAGNOSIS — I252 Old myocardial infarction: Secondary | ICD-10-CM | POA: Insufficient documentation

## 2014-05-02 DIAGNOSIS — D249 Benign neoplasm of unspecified breast: Secondary | ICD-10-CM

## 2014-05-02 HISTORY — DX: Irregular menstruation, unspecified: N92.6

## 2014-05-02 HISTORY — PX: BREAST BIOPSY: SHX20

## 2014-05-02 HISTORY — DX: Unspecified lump in the right breast, unspecified quadrant: N63.10

## 2014-05-02 HISTORY — DX: Acne, unspecified: L70.9

## 2014-05-02 LAB — POCT HEMOGLOBIN-HEMACUE: Hemoglobin: 12.1 g/dL (ref 12.0–15.0)

## 2014-05-02 SURGERY — BREAST BIOPSY
Anesthesia: Monitor Anesthesia Care | Site: Breast | Laterality: Right

## 2014-05-02 MED ORDER — SODIUM CHLORIDE 0.9 % IJ SOLN: 3.0000 mL | INTRAMUSCULAR | Status: DC | PRN

## 2014-05-02 MED ORDER — MIDAZOLAM HCL 2 MG/2ML IJ SOLN
INTRAMUSCULAR | Status: AC
  Filled 2014-05-02: qty 2

## 2014-05-02 MED ORDER — OXYCODONE HCL 5 MG/5ML PO SOLN: 5.0000 mg | Freq: Once | ORAL | Status: DC | PRN

## 2014-05-02 MED ORDER — SODIUM CHLORIDE 0.9 % IJ SOLN: 3.0000 mL | Freq: Two times a day (BID) | INTRAMUSCULAR | Status: DC

## 2014-05-02 MED ORDER — MIDAZOLAM HCL 5 MG/5ML IJ SOLN
INTRAMUSCULAR | Status: DC | PRN
  Administered 2014-05-02: 2 mg via INTRAVENOUS

## 2014-05-02 MED ORDER — CHLORHEXIDINE GLUCONATE 4 % EX LIQD: 1.0000 "application " | Freq: Once | CUTANEOUS | Status: DC

## 2014-05-02 MED ORDER — OXYCODONE-ACETAMINOPHEN 5-325 MG PO TABS: 1.0000 | ORAL_TABLET | ORAL | Status: DC | PRN

## 2014-05-02 MED ORDER — CEFAZOLIN SODIUM-DEXTROSE 2-3 GM-% IV SOLR
INTRAVENOUS | Status: DC | PRN
  Administered 2014-05-02: 2 g via INTRAVENOUS

## 2014-05-02 MED ORDER — LIDOCAINE HCL (CARDIAC) 20 MG/ML IV SOLN
INTRAVENOUS | Status: DC | PRN
  Administered 2014-05-02: 50 mg via INTRAVENOUS

## 2014-05-02 MED ORDER — 0.9 % SODIUM CHLORIDE (POUR BTL) OPTIME
TOPICAL | Status: DC | PRN
  Administered 2014-05-02: 200 mL

## 2014-05-02 MED ORDER — ACETAMINOPHEN 325 MG PO TABS
325.0000 mg | ORAL_TABLET | ORAL | Status: DC | PRN
Start: 2014-05-02 — End: 2014-05-02

## 2014-05-02 MED ORDER — FENTANYL CITRATE 0.05 MG/ML IJ SOLN: 50.0000 ug | INTRAMUSCULAR | Status: DC | PRN

## 2014-05-02 MED ORDER — OXYCODONE HCL 5 MG PO TABS: 5.0000 mg | ORAL_TABLET | Freq: Once | ORAL | Status: DC | PRN

## 2014-05-02 MED ORDER — PROMETHAZINE HCL 25 MG/ML IJ SOLN: 6.2500 mg | INTRAMUSCULAR | Status: DC | PRN

## 2014-05-02 MED ORDER — CEFAZOLIN SODIUM-DEXTROSE 2-3 GM-% IV SOLR
INTRAVENOUS | Status: AC
  Filled 2014-05-02: qty 50

## 2014-05-02 MED ORDER — ACETAMINOPHEN 650 MG RE SUPP: 650.0000 mg | RECTAL | Status: DC | PRN

## 2014-05-02 MED ORDER — CEFAZOLIN SODIUM-DEXTROSE 2-3 GM-% IV SOLR: 2.0000 g | INTRAVENOUS | Status: DC

## 2014-05-02 MED ORDER — LACTATED RINGERS IV SOLN: INTRAVENOUS | Status: DC

## 2014-05-02 MED ORDER — SODIUM CHLORIDE 0.9 % IV SOLN: 250.0000 mL | INTRAVENOUS | Status: DC | PRN

## 2014-05-02 MED ORDER — ACETAMINOPHEN 325 MG PO TABS: 650.0000 mg | ORAL_TABLET | ORAL | Status: DC | PRN

## 2014-05-02 MED ORDER — ACETAMINOPHEN 160 MG/5ML PO SOLN: 325.0000 mg | ORAL | Status: DC | PRN

## 2014-05-02 MED ORDER — FENTANYL CITRATE 0.05 MG/ML IJ SOLN
INTRAMUSCULAR | Status: DC | PRN
  Administered 2014-05-02: 50 ug via INTRAVENOUS
  Administered 2014-05-02 (×2): 25 ug via INTRAVENOUS

## 2014-05-02 MED ORDER — FENTANYL CITRATE 0.05 MG/ML IJ SOLN: 25.0000 ug | INTRAMUSCULAR | Status: DC | PRN

## 2014-05-02 MED ORDER — BUPIVACAINE HCL (PF) 0.25 % IJ SOLN
INTRAMUSCULAR | Status: AC
  Filled 2014-05-02: qty 30

## 2014-05-02 MED ORDER — FENTANYL CITRATE 0.05 MG/ML IJ SOLN
INTRAMUSCULAR | Status: AC
  Filled 2014-05-02: qty 6

## 2014-05-02 MED ORDER — KETOROLAC TROMETHAMINE 30 MG/ML IJ SOLN: 15.0000 mg | Freq: Once | INTRAMUSCULAR | Status: DC | PRN

## 2014-05-02 MED ORDER — MIDAZOLAM HCL 2 MG/2ML IJ SOLN: 1.0000 mg | INTRAMUSCULAR | Status: DC | PRN

## 2014-05-02 MED ORDER — LACTATED RINGERS IV SOLN
INTRAVENOUS | Status: DC | PRN
  Administered 2014-05-02: 13:00:00 via INTRAVENOUS

## 2014-05-02 MED ORDER — ONDANSETRON HCL 4 MG/2ML IJ SOLN
INTRAMUSCULAR | Status: DC | PRN
  Administered 2014-05-02: 4 mg via INTRAVENOUS

## 2014-05-02 MED ORDER — PROPOFOL INFUSION 10 MG/ML OPTIME
INTRAVENOUS | Status: DC | PRN
  Administered 2014-05-02: 100 ug/kg/min via INTRAVENOUS

## 2014-05-02 MED ORDER — OXYCODONE HCL 5 MG PO TABS: 5.0000 mg | ORAL_TABLET | ORAL | Status: DC | PRN

## 2014-05-02 MED ORDER — LIDOCAINE-EPINEPHRINE (PF) 1 %-1:200000 IJ SOLN
INTRAMUSCULAR | Status: DC | PRN
  Administered 2014-05-02: 13:00:00

## 2014-05-02 SURGICAL SUPPLY — 54 items
BINDER BREAST MEDIUM (GAUZE/BANDAGES/DRESSINGS) ×3 IMPLANT
BLADE 15 SAFETY STRL DISP (BLADE) ×3 IMPLANT
BLADE HEX COATED 2.75 (ELECTRODE) ×3 IMPLANT
BLADE SURG 15 STRL LF DISP TIS (BLADE) IMPLANT
BLADE SURG 15 STRL SS (BLADE)
CANISTER SUCT 1200ML W/VALVE (MISCELLANEOUS) ×3 IMPLANT
CHLORAPREP W/TINT 26ML (MISCELLANEOUS) ×3 IMPLANT
CLIP TI LARGE 6 (CLIP) IMPLANT
CLOSURE WOUND 1/2 X4 (GAUZE/BANDAGES/DRESSINGS) ×1
COVER MAYO STAND STRL (DRAPES) ×3 IMPLANT
COVER TABLE BACK 60X90 (DRAPES) ×3 IMPLANT
DECANTER SPIKE VIAL GLASS SM (MISCELLANEOUS) IMPLANT
DEVICE DUBIN W/COMP PLATE 8390 (MISCELLANEOUS) IMPLANT
DRAPE PED LAPAROTOMY (DRAPES) ×3 IMPLANT
DRAPE UTILITY XL STRL (DRAPES) ×6 IMPLANT
DRSG PAD ABDOMINAL 8X10 ST (GAUZE/BANDAGES/DRESSINGS) ×3 IMPLANT
ELECT REM PT RETURN 9FT ADLT (ELECTROSURGICAL) ×3
ELECTRODE REM PT RTRN 9FT ADLT (ELECTROSURGICAL) ×1 IMPLANT
GLOVE BIO SURGEON STRL SZ 6 (GLOVE) ×3 IMPLANT
GLOVE BIOGEL PI IND STRL 6.5 (GLOVE) ×2 IMPLANT
GLOVE BIOGEL PI IND STRL 7.5 (GLOVE) ×1 IMPLANT
GLOVE BIOGEL PI INDICATOR 6.5 (GLOVE) ×4
GLOVE BIOGEL PI INDICATOR 7.5 (GLOVE) ×2
GLOVE EXAM NITRILE MD LF STRL (GLOVE) ×3 IMPLANT
GLOVE SURG SS PI 6.0 STRL IVOR (GLOVE) ×6 IMPLANT
GLOVE SURG SS PI 7.0 STRL IVOR (GLOVE) ×3 IMPLANT
GOWN STRL REUS W/ TWL LRG LVL3 (GOWN DISPOSABLE) ×1 IMPLANT
GOWN STRL REUS W/TWL 2XL LVL3 (GOWN DISPOSABLE) ×3 IMPLANT
GOWN STRL REUS W/TWL LRG LVL3 (GOWN DISPOSABLE) ×2
KIT MARKER MARGIN INK (KITS) IMPLANT
NEEDLE HYPO 25X1 1.5 SAFETY (NEEDLE) ×3 IMPLANT
NS IRRIG 1000ML POUR BTL (IV SOLUTION) ×3 IMPLANT
PACK BASIN DAY SURGERY FS (CUSTOM PROCEDURE TRAY) ×3 IMPLANT
PENCIL BUTTON HOLSTER BLD 10FT (ELECTRODE) ×3 IMPLANT
SLEEVE SCD COMPRESS KNEE MED (MISCELLANEOUS) ×3 IMPLANT
SPONGE GAUZE 4X4 12PLY STER LF (GAUZE/BANDAGES/DRESSINGS) ×6 IMPLANT
SPONGE LAP 18X18 X RAY DECT (DISPOSABLE) ×3 IMPLANT
STAPLER VISISTAT 35W (STAPLE) IMPLANT
STRIP CLOSURE SKIN 1/2X4 (GAUZE/BANDAGES/DRESSINGS) ×2 IMPLANT
SUT MON AB 4-0 PC3 18 (SUTURE) ×3 IMPLANT
SUT SILK 2 0 SH (SUTURE) IMPLANT
SUT VIC AB 2-0 SH 27 (SUTURE) ×2
SUT VIC AB 2-0 SH 27XBRD (SUTURE) ×1 IMPLANT
SUT VIC AB 3-0 54X BRD REEL (SUTURE) IMPLANT
SUT VIC AB 3-0 BRD 54 (SUTURE)
SUT VIC AB 3-0 SH 27 (SUTURE) ×2
SUT VIC AB 3-0 SH 27X BRD (SUTURE) ×1 IMPLANT
SYR BULB 3OZ (MISCELLANEOUS) ×3 IMPLANT
SYR CONTROL 10ML LL (SYRINGE) ×3 IMPLANT
TOWEL OR 17X24 6PK STRL BLUE (TOWEL DISPOSABLE) ×3 IMPLANT
TOWEL OR NON WOVEN STRL DISP B (DISPOSABLE) ×3 IMPLANT
TUBE CONNECTING 20'X1/4 (TUBING) ×1
TUBE CONNECTING 20X1/4 (TUBING) ×2 IMPLANT
YANKAUER SUCT BULB TIP NO VENT (SUCTIONS) ×3 IMPLANT

## 2014-05-02 NOTE — H&P (Signed)
Crystal Stuart is an 33 y.o. female.   Chief Complaint: right breast mass.   HPI:  Pt is continuing to have discrete mass that is painful in upper outer quadrant of breast.  No change in sensation.    Past Medical History  Diagnosis Date  . Asthma     prn inhaler  . Breast mass, right 04/2014  . Heart murmur     states since age 7; no known problems  . Irregular periods   . Acne     Past Surgical History  Procedure Laterality Date  . Knee surgery Left 01/2000    arthroscopy  . Cesarean section  10/04/2005  . Dilation and curettage of uterus  08/30/2004    History reviewed. No pertinent family history. Social History:  reports that she has never smoked. She has never used smokeless tobacco. She reports that she drinks alcohol. She reports that she does not use illicit drugs.  Allergies:  Allergies  Allergen Reactions  . Shellfish Allergy Anaphylaxis  . Tetracyclines & Related Hives and Shortness Of Breath  . Latex Hives  . Soap Itching    BURNING AND ITCHING/DRYNESS OF SKIN    Medications Prior to Admission  Medication Sig Dispense Refill  . albuterol (PROVENTIL HFA;VENTOLIN HFA) 108 (90 BASE) MCG/ACT inhaler Inhale into the lungs every 6 (six) hours as needed for wheezing or shortness of breath.      . ISOtretinoin (ABSORICA PO) Take 80 mg by mouth daily.      . Multiple Vitamins-Minerals (MULTIVITAMIN GUMMIES ADULT PO) Take by mouth.      . Probiotic Product (PROBIOTIC DAILY PO) Take by mouth daily.        Results for orders placed during the hospital encounter of 05/02/14 (from the past 48 hour(s))  POCT HEMOGLOBIN-HEMACUE     Status: None   Collection Time    05/02/14 12:22 PM      Result Value Ref Range   Hemoglobin 12.1  12.0 - 15.0 g/dL   No results found.  Review of Systems  Constitutional: Negative.   HENT: Negative.   Eyes: Negative.   Respiratory: Negative.        Breast pain  Cardiovascular: Negative.   Gastrointestinal: Negative.     Genitourinary: Negative.   Musculoskeletal: Negative.   Skin: Negative.   Neurological: Negative.   Endo/Heme/Allergies: Negative.   Psychiatric/Behavioral: Negative.     Blood pressure 123/71, pulse 75, temperature 98.6 F (37 C), temperature source Oral, resp. rate 20, height 5' 11.5" (1.816 m), weight 151 lb 3.2 oz (68.584 kg), last menstrual period 03/26/2014, SpO2 99.00%. Physical Exam  Constitutional: She is oriented to person, place, and time. She appears well-developed and well-nourished. No distress.  HENT:  Head: Normocephalic and atraumatic.  Right Ear: External ear normal.  Left Ear: External ear normal.  Eyes: Conjunctivae are normal. Pupils are equal, round, and reactive to light. Right eye exhibits no discharge. Left eye exhibits no discharge.  Neck: Normal range of motion.  Cardiovascular: Normal rate.   Respiratory: Effort normal. No respiratory distress.    GI: Soft.  Musculoskeletal: Normal range of motion.  Neurological: She is alert and oriented to person, place, and time.  Skin: Skin is warm and dry. No rash noted. She is not diaphoretic. No erythema. No pallor.  Psychiatric: She has a normal mood and affect. Her behavior is normal. Judgment and thought content normal.     Assessment/Plan Pt presents for excision of right breast mass.   Discussed  risks and benefits with patient.    Stark Klein 05/02/2014, 12:50 PM

## 2014-05-02 NOTE — Discharge Instructions (Addendum)
[place] Office Phone Number [phone]  BREAST BIOPSY/Lumpectomy: POST OP INSTRUCTIONS  Always review your discharge instruction sheet given to you by the facility where your surgery was performed.  IF YOU HAVE DISABILITY OR FAMILY LEAVE FORMS, YOU MUST BRING THEM TO THE OFFICE FOR PROCESSING.  DO NOT GIVE THEM TO YOUR DOCTOR.  1. A prescription for pain medication may be given to you upon discharge.  Take your pain medication as prescribed, if needed.  If narcotic pain medicine is not needed, then you may take acetaminophen (Tylenol) or ibuprofen (Advil) as needed. 2. Take your usually prescribed medications unless otherwise directed 3. If you need a refill on your pain medication, please contact your pharmacy.  They will contact our office to request authorization.  Prescriptions will not be filled after 5pm or on week-ends. 4. You should eat very light the first 24 hours after surgery, such as soup, crackers, pudding, etc.  Resume your normal diet the day after surgery. 5. Most patients will experience some swelling and bruising in the breast.  Ice packs and a good support bra will help.  Swelling and bruising can take several days to resolve.  6. It is common to experience some constipation if taking pain medication after surgery.  Increasing fluid intake and taking a stool softener will usually help or prevent this problem from occurring.  A mild laxative (Milk of Magnesia or Miralax) should be taken according to package directions if there are no bowel movements after 48 hours. 7. Unless discharge instructions indicate otherwise, you may remove your bandages 48 hours after surgery, and you may shower at that time.  You may have steri-strips (small skin tapes) in place directly over the incision.  These strips should be left on the skin for 7-10 days.   Any sutures or staples will be removed at the office during your follow-up visit. 8. ACTIVITIES:  You may resume regular  daily activities (gradually increasing) beginning the next day.  Wearing a good support bra or sports bra (or the breast binder) minimizes pain and swelling.  You may have sexual intercourse when it is comfortable. a. You may drive when you no longer are taking prescription pain medication, you can comfortably wear a seatbelt, and you can safely maneuver your car and apply brakes. b. RETURN TO WORK:  __________1 week_______________ 9. You should see your doctor in the office for a follow-up appointment approximately two weeks after your surgery.  Your doctors nurse will typically make your follow-up appointment when she calls you with your pathology report.  Expect your pathology report 2-3 business days after your surgery.  You may call to check if you do not hear from [place] after three days.   WHEN TO CALL YOUR DOCTOR: 1. Fever over 101.0 2. Nausea and/or vomiting. 3. Extreme swelling or bruising. 4. Continued bleeding from incision. 5. Increased pain, redness, or drainage from the incision.  The clinic staff is available to answer your questions during regular business hours.  Please dont hesitate to call and ask to speak to one of the nurses for clinical concerns.  If you have a medical emergency, go to the nearest emergency room or call 911.  A surgeon from [facility] is always on call at the hospital.  For further questions, please visit [email or web address]     Post Anesthesia Home Care Instructions  Activity: Get plenty of rest for the remainder of the day. A responsible adult should stay with you for 24 hours  following the procedure.  For the next 24 hours, DO NOT: -Drive a car -Paediatric nurse -Drink alcoholic beverages -Take any medication unless instructed by your physician -Make any legal decisions or sign important papers.  Meals: Start with liquid foods such as gelatin or soup. Progress to regular foods as tolerated. Avoid greasy, spicy, heavy foods.  If nausea and/or vomiting occur, drink only clear liquids until the nausea and/or vomiting subsides. Call your physician if vomiting continues.  Special Instructions/Symptoms: Your throat may feel dry or sore from the anesthesia or the breathing tube placed in your throat during surgery. If this causes discomfort, gargle with warm salt water. The discomfort should disappear within 24 hours.

## 2014-05-02 NOTE — Op Note (Signed)
Excisional Breast Biopsy  Indications: This patient presents with history of right breast mass, painful, enlarging.  Pre-operative Diagnosis: right breast mass  Post-operative Diagnosis: right breast mass  Surgeon: Stark Klein   Anesthesia: Local anesthesia 1% plain lidocaine, 0.25.% bupivacaine, with epinephrine and Monitored Local Anesthesia with Sedation  ASA Class: 2  Procedure Details  The patient was seen in the Holding Room. The risks, benefits, complications, treatment options, and expected outcomes were discussed with the patient. The possibilities of reaction to medication, pulmonary aspiration, bleeding, infection, the need for additional procedures, failure to diagnose a condition, and creating a complication requiring transfusion or operation were discussed with the patient. The patient concurred with the proposed plan, giving informed consent.  The site of surgery properly noted/marked. The patient was taken to Operating Room # 5, identified, and the procedure verified as Breast Excisional Biopsy. A Time Out was held and the above information confirmed.  After induction of anesthesia, the right  breast and chest were prepped and draped in standard fashion. The lumpectomy was performed by creating an oblique incision over the upper outer quadrant of the breast.  Dissection was carried down to the pectoralis fascia.   Hemostasis was achieved with cautery.  The wound was irrigated and closed with a 3-0 Vicryl interrupted deep dermal stitch and a 4-0 Monocryl subcuticular closure in layers.    Sterile dressings were applied. At the end of the operation, all sponge, instrument, and needle counts were correct.  Findings: very dense breast tissue.  clinically mass appeared to be fibrotic breast tissue  Estimated Blood Loss:  Minimal            Specimens: right breast mass         Complications:  None; patient tolerated the procedure well.         Disposition: PACU -  hemodynamically stable.         Condition: stable

## 2014-05-02 NOTE — Anesthesia Preprocedure Evaluation (Signed)
Anesthesia Evaluation  Patient identified by MRN, date of birth, ID band Patient awake    Reviewed: Allergy & Precautions, H&P , NPO status , Patient's Chart, lab work & pertinent test results  History of Anesthesia Complications Negative for: history of anesthetic complications  Airway Mallampati: II TM Distance: >3 FB Neck ROM: Full    Dental  (+) Teeth Intact   Pulmonary asthma , neg sleep apnea, neg recent URI,  breath sounds clear to auscultation        Cardiovascular - angina- Past MI and - CHF - dysrhythmias Rhythm:Regular     Neuro/Psych negative neurological ROS     GI/Hepatic negative GI ROS, Neg liver ROS,   Endo/Other  negative endocrine ROS  Renal/GU negative Renal ROS     Musculoskeletal negative musculoskeletal ROS (+)   Abdominal   Peds  Hematology negative hematology ROS (+)   Anesthesia Other Findings   Reproductive/Obstetrics                           Anesthesia Physical Anesthesia Plan  ASA: II  Anesthesia Plan: MAC   Post-op Pain Management:    Induction: Intravenous  Airway Management Planned: Natural Airway and Simple Face Mask  Additional Equipment: None  Intra-op Plan:   Post-operative Plan: Extubation in OR  Informed Consent: I have reviewed the patients History and Physical, chart, labs and discussed the procedure including the risks, benefits and alternatives for the proposed anesthesia with the patient or authorized representative who has indicated his/her understanding and acceptance.   Dental advisory given  Plan Discussed with: CRNA and Surgeon  Anesthesia Plan Comments:         Anesthesia Quick Evaluation

## 2014-05-02 NOTE — Anesthesia Postprocedure Evaluation (Signed)
  Anesthesia Post-op Note  Patient: Crystal Stuart  Procedure(s) Performed: Procedure(s): EXCISION OF RIGHT BREAST MASS (Right)  Patient Location: PACU  Anesthesia Type:MAC  Level of Consciousness: awake and alert   Airway and Oxygen Therapy: Patient Spontanous Breathing  Post-op Pain: mild  Post-op Assessment: Post-op Vital signs reviewed, Patient's Cardiovascular Status Stable, Respiratory Function Stable and No signs of Nausea or vomiting  Post-op Vital Signs: Reviewed and stable  Last Vitals:  Filed Vitals:   05/02/14 1430  BP: 117/76  Pulse: 70  Temp:   Resp: 18    Complications: No apparent anesthesia complications

## 2014-05-02 NOTE — Transfer of Care (Signed)
Immediate Anesthesia Transfer of Care Note  Patient: Crystal Stuart  Procedure(s) Performed: Procedure(s): EXCISION OF RIGHT BREAST MASS (Right)  Patient Location: PACU  Anesthesia Type:MAC  Level of Consciousness: awake, alert  and oriented  Airway & Oxygen Therapy: Patient Spontanous Breathing and Patient connected to face mask oxygen  Post-op Assessment: Report given to PACU RN and Post -op Vital signs reviewed and stable  Post vital signs: Reviewed and stable  Complications: No apparent anesthesia complications

## 2014-05-04 ENCOUNTER — Telehealth (INDEPENDENT_AMBULATORY_CARE_PROVIDER_SITE_OTHER): Payer: Self-pay | Admitting: Surgery

## 2014-05-04 NOTE — Telephone Encounter (Signed)
Ms. Pelto had a breast biopsy Friday, 5/15.  She has developed a rash and is itching.  She is not taking pain meds.  She says this has happened before.  She has tried Benadryl, but it just makes her sleepy.  She wanted me to call in a Prednisone dose pack, but I am hesitant having not seen her.  She has a history of multiple allergies.  She did not feel comfortable calling her PCP about this problem.  I advised her to call our office tomorrow if she is no better.  Alphonsa Overall, MD, [facility] Pager: [phone] Office phone:  [phone]

## 2014-05-05 ENCOUNTER — Telehealth (INDEPENDENT_AMBULATORY_CARE_PROVIDER_SITE_OTHER): Payer: Self-pay

## 2014-05-05 NOTE — Telephone Encounter (Signed)
Message copied by Jeralyn Ruths on Mon May 05, 2014  9:20 AM ------      Message from: Shann Medal      Created: Sun May 04, 2014  3:35 PM      Regarding: Please call patient       Crystal Stuart had a breast biopsy Friday, 5/15.  She has developed a rash and is itching.  She is not taking pain meds.  She says this has happened before.  She has tried Benadryl, but it just makes her sleepy.            She wanted me to call in a Prednisone dose pack, but I am hesitant having not seen her.  She has a history of multiple allergies.  She did not feel comfortable calling her PCP about this problem.            If you would call her Monday and see how she is doing.            Thanks,      Shanon Brow             ------

## 2014-05-05 NOTE — Addendum Note (Signed)
Addendum created 05/05/14 0741 by Tawni Millers, CRNA   Modules edited: Charges VN

## 2014-05-05 NOTE — Telephone Encounter (Signed)
Called the patient this morning.  She has a rash which she says has spread upper chest, neck and chin, along with some on her back.  She is convinced this is medication related, but cannot pin point which medication.  She is requesting Prednisone again.  Consulted with Dr. Donne Hazel in the office [facility] off after call), and he said we would not prescribe Prednisone without seeing the patient in clinic.  Pt offered appt in Urgent Office today with Dr. Zella Richer.  She chooses to see her allergist instead.

## 2014-05-06 ENCOUNTER — Telehealth (INDEPENDENT_AMBULATORY_CARE_PROVIDER_SITE_OTHER): Payer: Self-pay

## 2014-05-06 NOTE — Telephone Encounter (Signed)
Pt made aware her breast pathology is negative for cancer.

## 2014-05-07 ENCOUNTER — Encounter (HOSPITAL_BASED_OUTPATIENT_CLINIC_OR_DEPARTMENT_OTHER): Payer: Self-pay | Admitting: General Surgery

## 2014-05-21 ENCOUNTER — Encounter (HOSPITAL_BASED_OUTPATIENT_CLINIC_OR_DEPARTMENT_OTHER): Payer: Self-pay | Admitting: General Surgery

## 2014-05-27 ENCOUNTER — Encounter (INDEPENDENT_AMBULATORY_CARE_PROVIDER_SITE_OTHER): Payer: Self-pay | Admitting: General Surgery

## 2014-05-27 ENCOUNTER — Ambulatory Visit (INDEPENDENT_AMBULATORY_CARE_PROVIDER_SITE_OTHER): Payer: BC Managed Care – PPO | Admitting: General Surgery

## 2014-05-27 VITALS — BP 110/75 | HR 77 | Temp 98.7°F | Resp 14 | Ht 71.5 in | Wt 154.0 lb

## 2014-05-27 DIAGNOSIS — N6310 Unspecified lump in the right breast, unspecified quadrant: Secondary | ICD-10-CM

## 2014-05-27 DIAGNOSIS — N63 Unspecified lump in unspecified breast: Secondary | ICD-10-CM

## 2014-05-27 NOTE — Assessment & Plan Note (Signed)
No evidence of surgical complications.  Follow up as needed.

## 2014-05-27 NOTE — Progress Notes (Signed)
HISTORY: Pt is s/p excision of right breast mass.  She is not having any pain.  She denies using any more analgesics.  She had minimal discomfort after surgery.      EXAM: General:  Alert and oriented.  Incision:  Small seroma.  Healing well.     PATHOLOGY: Diagnosis Breast, excision, Right - PSEUDOANGIOMATOUS STROMAL HYPERPLASIA ([place]). - FIBROCYSTIC CHANGES. - THERE IS NO EVIDENCE OF MALIGNANCY. - SEE COMMENT.   ASSESSMENT AND PLAN:   Breast mass, right No evidence of surgical complications.  Follow up as needed.        Milus Height, MD Surgical Oncology, [facility]  Earnstine Regal, PA-C Earnstine Regal, [place]

## 2014-05-27 NOTE — Patient Instructions (Signed)
Follow up PRN

## 2014-08-21 ENCOUNTER — Encounter: Payer: Self-pay | Admitting: Gastroenterology

## 2018-09-25 ENCOUNTER — Ambulatory Visit: Payer: BC Managed Care – PPO | Admitting: Allergy

## 2018-09-25 ENCOUNTER — Encounter: Payer: Self-pay | Admitting: Allergy

## 2018-09-25 VITALS — BP 130/82 | HR 70 | Temp 98.9°F | Resp 18 | Ht 69.6 in | Wt 176.0 lb

## 2018-09-25 DIAGNOSIS — Z91018 Allergy to other foods: Secondary | ICD-10-CM

## 2018-09-25 DIAGNOSIS — J309 Allergic rhinitis, unspecified: Secondary | ICD-10-CM

## 2018-09-25 DIAGNOSIS — H01132 Eczematous dermatitis of right lower eyelid: Secondary | ICD-10-CM

## 2018-09-25 DIAGNOSIS — J4520 Mild intermittent asthma, uncomplicated: Secondary | ICD-10-CM | POA: Diagnosis not present

## 2018-09-25 DIAGNOSIS — H01134 Eczematous dermatitis of left upper eyelid: Secondary | ICD-10-CM

## 2018-09-25 DIAGNOSIS — H1010 Acute atopic conjunctivitis, unspecified eye: Secondary | ICD-10-CM

## 2018-09-25 DIAGNOSIS — H01131 Eczematous dermatitis of right upper eyelid: Secondary | ICD-10-CM

## 2018-09-25 DIAGNOSIS — H01135 Eczematous dermatitis of left lower eyelid: Secondary | ICD-10-CM

## 2018-09-25 MED ORDER — AUVI-Q 0.3 MG/0.3ML IJ SOAJ: INTRAMUSCULAR | 1 refills | Status: AC

## 2018-09-25 MED ORDER — PIMECROLIMUS 1 % EX CREA: TOPICAL_CREAM | CUTANEOUS | 5 refills | Status: DC

## 2018-09-25 NOTE — Progress Notes (Addendum)
New Patient Note  RE: Xochil Shanker MRN: [id number] DOB: 1981/11/09 Date of Office Visit: 09/25/2018  Referring provider: No ref. provider found Primary care provider: Willey Blade, MD  Chief Complaint: allergies  History of present illness: Lela Murfin is a 37 y.o. female presenting today for evaluation of allergies.   She is interested in restarting immunotherapy.   She was on immunotherapy with another allergist however stopped more than 6 months ago.  She states she started around 2016.  She was on maintenance around Nov 2017.  Denies any significant reactions with immunotherapy.  She states she could notice improvement in symptoms on maintenance dosing.  She states she is allergic to cats, dogs, roach, mold, pollens.   Symptoms include eyelid itch and rash that she is most concerned about.  She also states she wakes up sometimes at night feeling like she is choking/SOB.   She has used her albuterol sometimes for this and other times she does more deep breathing/relaxation techniques and this helps.  She reports occasional nasal symptoms (congestion and drainage).  She does report sneezing and ear fullness as well.  She is currently taking xyzal which she feels does help for the most time but does sometimes alternate it with zyrtec.  She also will occasionally take hydroxyzine.  For her eyelid dermatitis she states she has used aquafor but did not like the appearance and lately has been using eucerin.    She has childhood onset asthma.  She states symptoms went away in early adulthood but returned after she had her son.  Extremes of temperature can cause symptoms.  She only has albuterol currently and is on singulair daily.  Denies hospitalization.  Has not required steroid use for asthma symptoms.    Shellfish allergy since 37yo.  With crableg she developed stridor and difficulty breathing after ingestion.  She avoids crustaceans (crab, lobster, shrimp).  She can eat  oysters/mollusks without issue.  She has an epipen.    She was using 2 natural herb solution primarily to help with concentration/focus but stopped as was unsure how it would react with resuming immunotherapy.      Review of systems: Review of Systems  Constitutional: Negative for chills, fever and malaise/fatigue.  HENT: Negative for congestion, ear discharge, ear pain, nosebleeds and sore throat.   Eyes: Negative for pain, discharge and redness.  Respiratory: Negative for cough, shortness of breath and wheezing.   Cardiovascular: Negative for chest pain.  Gastrointestinal: Negative for abdominal pain, constipation, diarrhea, heartburn, nausea and vomiting.  Musculoskeletal: Negative for joint pain.  Skin: Positive for itching and rash.  Neurological: Negative for headaches.    All other systems negative unless noted above in HPI  Past medical history: Past Medical History:  Diagnosis Date  . Acne   . Asthma    prn inhaler  . Breast mass, right 04/2014  . Heart murmur    states since age 33; no known problems  . Irregular periods     Past surgical history: Past Surgical History:  Procedure Laterality Date  . BREAST BIOPSY Right 05/02/2014   Procedure: EXCISION OF RIGHT BREAST MASS;  Surgeon: Stark Klein, MD;  Location: [facility];  Service: General;  Laterality: Right;  . BREAST ENHANCEMENT SURGERY    . CESAREAN SECTION  10/04/2005  . DILATION AND CURETTAGE OF UTERUS  08/30/2004  . KNEE SURGERY Left 01/2000   arthroscopy    Family history:  Family History  Problem Relation Age of Onset  .  Stroke Mother   . Seizures Mother   . Stroke Father   . Hypertension Father   . Wilm's tumor Sister   . Hypertension Maternal Grandmother   . Stroke Maternal Grandmother   . Crohn's disease Maternal Grandmother   . High Cholesterol Maternal Grandmother   . Diabetes Maternal Grandmother   . Cancer Paternal Grandmother   . Kidney failure Paternal Grandmother   .  Allergic rhinitis Son     Social history: Lives in a home with carpeting with gas heating and central cooling. Dog in the home.  No concern for water damage, mildew or roaches in the home.  She is a NP.  Denies smoking history.    Medication List: Allergies as of 09/25/2018      Reactions   Doxycycline Monohydrate Hives, Shortness Of Breath   Minocycline Shortness Of Breath   Shellfish Allergy Anaphylaxis   Tetracyclines & Related Hives, Shortness Of Breath   Latex Hives   Tizanidine    Possible shortness of breath   Breo Ellipta [fluticasone Furoate-vilanterol] Palpitations   Soap Itching   BURNING AND ITCHING/DRYNESS OF SKIN      Medication List        Accurate as of 09/25/18  4:23 PM. Always use your most recent med list.          albuterol 108 (90 Base) MCG/ACT inhaler Commonly known as:  PROVENTIL HFA;VENTOLIN HFA Inhale into the lungs every 6 (six) hours as needed for wheezing or shortness of breath.   AUVI-Q 0.3 mg/0.3 mL Soaj injection Generic drug:  EPINEPHrine Use as directed for life-threatening allergic reaction.   hydrOXYzine 25 MG tablet Commonly known as:  ATARAX/VISTARIL Take 25 mg by mouth every 8 (eight) hours as needed.   levocetirizine 5 MG tablet Commonly known as:  XYZAL every evening.   Magnesium 400 MG Tabs Take by mouth daily.   montelukast 10 MG tablet Commonly known as:  SINGULAIR Take 10 mg by mouth every evening.   pimecrolimus 1 % cream Commonly known as:  ELIDEL Can apply to eyelids twice daily as needed.       Known medication allergies: Allergies  Allergen Reactions  . Doxycycline Monohydrate Hives and Shortness Of Breath  . Minocycline Shortness Of Breath  . Shellfish Allergy Anaphylaxis  . Tetracyclines & Related Hives and Shortness Of Breath  . Latex Hives  . Tizanidine     Possible shortness of breath  . Breo Ellipta [Fluticasone Furoate-Vilanterol] Palpitations  . Soap Itching    BURNING AND ITCHING/DRYNESS OF  SKIN     Physical examination: Blood pressure 130/82, pulse 70, temperature 98.9 F (37.2 C), temperature source Oral, resp. rate 18, height 5' 9.6" (1.768 m), weight 176 lb (79.8 kg).  General: Alert, interactive, in no acute distress. HEENT: PERRLA, TMs pearly gray, turbinates minimally edematous without discharge, post-pharynx non erythematous. Neck: Supple without lymphadenopathy. Lungs: Clear to auscultation without wheezing, rhonchi or rales. {no increased work of breathing. CV: Normal S1, S2 without murmurs. Abdomen: Nondistended, nontender. Skin: slight flaking of left upper lid near inner corner. Extremities:  No clubbing, cyanosis or edema. Neuro:   Grossly intact.  Diagnositics/Labs: Labs: food allergen panel performed by PCP from 08/30/17 shows IgE of kU/L - crab 1.36, shrimp 6.85, oyster 0.34  Spirometry: FEV1: 3.4L 107%, FVC: 4.52L 117%, ratio consistent with nonobstructive pattern  Allergy testing: unable to perform due to recent antihistamine use.  Pt reports was not able to come off of antihistamine due to symptoms  Assessment and plan:   Allergic rhinoconjunctivitis   - will obtain environmental allergy panel in preparation for restarting allergen immunotherapy.     - continue xyzal 5mg  daily   - provided with samples of Ryvent which is an 1st gen antihistamine.  This can replace xyzal and/or zyrtec if more effective.  Take ryvent 6mg  1 tablet twice a day   - continue singulair 10mg  daily    Eyelid dermatitis   - will prescribe Elidel for as needed use on eyelid if red/itchy/dry/flaky.  Apply thin layer to lid twice a day until resolved   - continue moisturization with emollients like Eucerin that you are using  Food allergy   - continue avoidance of crustaceans (shrimp, lobster, crab)    - have access to self-injectable epinephrine (Epipen or AuviQ) 0.3mg  at all times    - follow emergency action plan in case of allergic reaction  Asthma, mild  intermittent   - lung function today is normal   - singulair as above   - have access to albuterol inhaler 2 puffs every 4-6 hours as needed for cough/wheeze/shortness of breath/chest tightness.  May use 15-20 minutes prior to activity.   Monitor frequency of use.     Follow-up 3-4 months or sooner if needed  I appreciate the opportunity to take part in Teliyah's care. Please do not hesitate to contact me with questions.  Sincerely,   Prudy Feeler, MD Allergy/Immunology [facility]

## 2018-09-25 NOTE — Patient Instructions (Signed)
Allergic rhinoconjunctivitis   - will obtain environmental allergy panel in preparation for restarting allergen immunotherapy.     - continue xyzal 5mg  daily   - provided with samples of Ryvent which is an 1st gen antihistamine.  This can replace xyzal and/or zyrtec if more effective.  Take ryvent 6mg  1 tablet twice a day   - continue singulair 10mg  daily    Eyelid dermatitis   - will prescribe Elidel for as needed use on eyelid if red/itchy/dry/flaky.  Apply thin layer to lid twice a day until resolved   - continue moisturization with emollients like Eucerin that you are using  Food allergy   - continue avoidance of crustaceans (shrimp, lobster, crab)    - have access to self-injectable epinephrine (Epipen or AuviQ) 0.3mg  at all times    - follow emergency action plan in case of allergic reaction  Asthma   - lung function today is normal   - singulair as above   - have access to albuterol inhaler 2 puffs every 4-6 hours as needed for cough/wheeze/shortness of breath/chest tightness.  May use 15-20 minutes prior to activity.   Monitor frequency of use.     Follow-up 3-4 months or sooner if needed

## 2018-09-26 ENCOUNTER — Encounter: Payer: Self-pay | Admitting: Allergy

## 2018-10-18 ENCOUNTER — Ambulatory Visit: Payer: Self-pay | Admitting: Allergy

## 2020-01-30 ENCOUNTER — Ambulatory Visit: Payer: Self-pay | Attending: Family

## 2020-01-30 DIAGNOSIS — Z23 Encounter for immunization: Secondary | ICD-10-CM | POA: Insufficient documentation

## 2020-02-04 NOTE — Progress Notes (Signed)
   Covid-19 Vaccination Clinic  Name:  Crystal Stuart    MRN: [id number] DOB: 04-11-1981  02/04/2020  [name] was observed post Covid-19 immunization for 30 minutes based on pre-vaccination screening without incidence. She was provided with Vaccine Information Sheet and instruction to access the V-Safe system.   [name] was instructed to call 911 with any severe reactions post vaccine: Marland Kitchen Difficulty breathing  . Swelling of your face and throat  . A fast heartbeat  . A bad rash all over your body  . Dizziness and weakness    Immunizations Administered    Name Date Dose VIS Date Route   Moderna COVID-19 Vaccine 01/30/2020  2:00 PM 0.5 mL 11/19/2019 Intramuscular   Manufacturer: Moderna   Lot: [id number]   [id number]

## 2020-03-03 ENCOUNTER — Ambulatory Visit: Payer: Self-pay | Attending: Family

## 2020-03-03 DIAGNOSIS — Z23 Encounter for immunization: Secondary | ICD-10-CM

## 2020-03-03 NOTE — Progress Notes (Signed)
   Covid-19 Vaccination Clinic  Name:  Crystal Stuart    MRN: [id number] DOB: 01/26/1981  03/03/2020  [name] was observed post Covid-19 immunization for 15 minutes without incident. She was provided with Vaccine Information Sheet and instruction to access the V-Safe system.   [name] was instructed to call 911 with any severe reactions post vaccine: Marland Kitchen Difficulty breathing  . Swelling of face and throat  . A fast heartbeat  . A bad rash all over body  . Dizziness and weakness   Immunizations Administered    Name Date Dose VIS Date Route   Moderna COVID-19 Vaccine 03/03/2020  3:03 PM 0.5 mL 11/19/2019 Intramuscular   Manufacturer: Moderna   Lot: [id number]   [id number]

## 2020-12-22 ENCOUNTER — Ambulatory Visit (INDEPENDENT_AMBULATORY_CARE_PROVIDER_SITE_OTHER): Payer: Self-pay | Admitting: Allergy

## 2020-12-22 ENCOUNTER — Other Ambulatory Visit: Payer: Self-pay

## 2020-12-22 ENCOUNTER — Encounter: Payer: Self-pay | Admitting: Allergy

## 2020-12-22 VITALS — BP 110/72 | HR 76 | Resp 16 | Ht 71.0 in | Wt 178.8 lb

## 2020-12-22 DIAGNOSIS — T7800XD Anaphylactic reaction due to unspecified food, subsequent encounter: Secondary | ICD-10-CM

## 2020-12-22 DIAGNOSIS — J3089 Other allergic rhinitis: Secondary | ICD-10-CM

## 2020-12-22 DIAGNOSIS — L500 Allergic urticaria: Secondary | ICD-10-CM

## 2020-12-22 DIAGNOSIS — L2089 Other atopic dermatitis: Secondary | ICD-10-CM

## 2020-12-22 DIAGNOSIS — J4520 Mild intermittent asthma, uncomplicated: Secondary | ICD-10-CM

## 2020-12-22 DIAGNOSIS — H1013 Acute atopic conjunctivitis, bilateral: Secondary | ICD-10-CM

## 2020-12-22 NOTE — Progress Notes (Signed)
Follow-up Note  RE: Crystal Stuart MRN: [id number] DOB: 1981-04-15 Date of Office Visit: 12/22/2020   History of present illness: Crystal Stuart is a 40 y.o. female presenting today for follow-up of allergic rhinitis with conjunctivitis, asthma, food allergy and eyelid dermatitis.  She was last seen in the office on 09/25/2018 by myself.  She states she continues to have allergies throughout the year.  She does feel that dog exposure may be exacerbating her symptoms.  The dog is in the home.  She feels like her allergy symptoms started getting worse about 2-3 months ago where she was feeling itchier and having hives as well.  She states she was so symptomatic to the point that she was taking Benadryl and hydroxyzine around-the-clock with her Xyzal.  She states she does acupuncture and it was recommended that she try to supplement Quercetin which she has been taking recently and does report that she is felt less itchy and has not needed to use hydroxyzine or Benadryl in the past week while she has been on it.  She also reports taking a holy basil supplement.  She has also had a prednisone burst about 3-4 weeks ago prescribed by PCP and states that itching hives resolve on prednisone with it when she finished the symptoms came back. She does states when she went to the beach and was away from the dog she did have improvement in symptoms but when she came back home symptoms worsened.  Then she connected that she likely is dog allergic.   She also reports itchy eyes and some nasal drainage. She states that her eyelid dermatitis has gotten worse and is now involving her cheeks and neck as well.  She states her skin is just overall dry.  She has been using Cetaphil.  She states that she has used Eucerin around the eyes she feels like it made the skin more wrinkly.  When she has used Aquaphor around the eye it seemed to make it burn which she had a cut there.  She does have triamcinolone that she will use  on the body and has spot treated on the face.  She is not sure if she ever got the Elidel recommended from last visit or not. She does continue to take Singulair daily.  She states she uses her albuterol quite infrequently reports less than twice a week on average.  She has not required any ED or urgent care visits for asthma flare or any systemic steroid needs. She continues to avoid crustaceans shellfish and has not had any accidental ingestions.  She states her epinephrine device is not current.  Review of systems: Review of Systems  Constitutional: Negative.   HENT:       See HPI  Eyes:       See HPI  Respiratory: Negative.   Cardiovascular: Negative.   Gastrointestinal: Negative.   Musculoskeletal: Negative.   Skin: Positive for itching and rash.  Neurological: Negative.     All other systems negative unless noted above in HPI  Past medical/social/surgical/family history have been reviewed and are unchanged unless specifically indicated below.  No changes  Medication List: Current Outpatient Medications  Medication Sig Dispense Refill  . albuterol (PROVENTIL HFA;VENTOLIN HFA) 108 (90 BASE) MCG/ACT inhaler Inhale into the lungs every 6 (six) hours as needed for wheezing or shortness of breath.    Marland Kitchen AUVI-Q 0.3 MG/0.3ML SOAJ injection Use as directed for life-threatening allergic reaction. 4 Device 1  . eszopiclone (LUNESTA) 2 MG  TABS tablet Take 2 mg by mouth at bedtime as needed for sleep. Take immediately before bedtime    . hydrOXYzine (ATARAX/VISTARIL) 25 MG tablet Take 25 mg by mouth every 8 (eight) hours as needed.  1  . levocetirizine (XYZAL) 5 MG tablet every evening.  2  . Magnesium 400 MG TABS Take by mouth daily.    . montelukast (SINGULAIR) 10 MG tablet Take 10 mg by mouth every evening.  2  . triamcinolone ointment (KENALOG) 0.1 % Apply topically.    . pimecrolimus (ELIDEL) 1 % cream Can apply to eyelids twice daily as needed. (Patient not taking: Reported on  12/22/2020) 30 g 5   No current facility-administered medications for this visit.     Known medication allergies: Allergies  Allergen Reactions  . Doxycycline Monohydrate Hives and Shortness Of Breath  . Minocycline Shortness Of Breath  . Shellfish Allergy Anaphylaxis  . Tetracyclines & Related Hives and Shortness Of Breath  . Latex Hives  . Tizanidine     Possible shortness of breath  . Breo Ellipta [Fluticasone Furoate-Vilanterol] Palpitations  . Soap Itching    BURNING AND ITCHING/DRYNESS OF SKIN     Physical examination: Blood pressure 110/72, pulse 76, resp. rate 16, height 5\' 11"  (1.803 m), weight 178 lb 12.8 oz (81.1 kg), SpO2 98 %.  General: Alert, interactive, in no acute distress. HEENT: PERRLA, TMs pearly gray, turbinates non-edematous without discharge, post-pharynx non erythematous. Neck: Supple without lymphadenopathy. Lungs: Clear to auscultation without wheezing, rhonchi or rales. {no increased work of breathing. CV: Normal S1, S2 without murmurs. Abdomen: Nondistended, nontender. Skin: Bilateral facial cheeks with mild erythema with flakiness. Extremities:  No clubbing, cyanosis or edema. Neuro:   Grossly intact.  Diagnositics/Labs: None today  Assessment and plan:   Allergic rhinitis with conjunctivitis   - once insurance kicks in let us know which testing option you would like to do: skin testing in office (hold antihistamines for 3 days prior) or environmental allergy panel (includes IgE levels)   - continue xyzal 5mg  daily   - continue singulair 10mg  daily   - for itchy/watery eyes can use over-the-counter Pataday or Pataday Xtra Stength 1 drop each eye daily as needed   - you would likely benefit from another course of allergen immunotherapy to help decrease your sensitivity to allergens (ie dog in the home) and decrease medication needs  Urticaria and pruritus  - Xyzal and singulair as above  - if you have worsening hives and itch then recommend  adding in Pepcid 20mg  to take along with Xyzal for more histamine blockade  - can continue supplements if effective    Atopic dermatitis   - use Eucrisa 1 thin application to itchy, dry, irritate, red, flaky, scaly areas.  Can use on the face and eyelid.  Avoid getting in the eye.  This is a non-steroid ointment for dermatitis.  Can refrigerate if having a burning sensation.     - continue moisturization with emollients like Cetaphil, Cerave, Vanicream.  Provided with Vanicream samples  Anaphylaxis due to food   - continue avoidance of crustaceans (shrimp, lobster, crab)    - have access to self-injectable epinephrine (Epipen or AuviQ) 0.3mg  at all times    - follow emergency action plan in case of allergic reaction  Asthma, mild intermittent   - singulair as above   - have access to albuterol inhaler 2 puffs every 4-6 hours as needed for cough/wheeze/shortness of breath/chest tightness.  May use 15-20 minutes prior  to activity.   Monitor frequency of use.    Control goals:   Full participation in all desired activities (may need albuterol before activity)  Albuterol use two time or less a week on average (not counting use with activity)  Cough interfering with sleep two time or less a month  Oral steroids no more than once a year  No hospitalizations   Follow-up 4-6 months or sooner if needed I appreciate the opportunity to take part in Crystal Stuart's care. Please do not hesitate to contact me with questions.  Sincerely,   Prudy Feeler, MD Allergy/[facility]ology [facility]

## 2020-12-22 NOTE — Patient Instructions (Addendum)
Allergic rhinoconjunctivitis   - once insurance kicks in let us know which testing option you would like to do: skin testing in office (hold antihistamines for 3 days prior) or environmental allergy panel (includes IgE levels)   - continue xyzal 5mg  daily   - continue singulair 10mg  daily   - for itchy/watery eyes can use over-the-counter Pataday or Pataday Xtra Stength 1 drop each eye daily as needed   - you would likely benefit from another course of allergen immunotherapy to help decrease your sensitivity to allergens (ie dog in the home) and decrease medication needs  Hives and Itch  - Xyzal and singulair as above  - if you have worsening hives and itch then recommend adding in Pepcid 20mg  to take along with Xyzal for more histamine blockade  - can continue supplements if effective    Eyelid dermatitis   - use Eucrisa 1 thin application to itchy, dry, irritate, red, flaky, scaly areas.  Can use on the face and eyelid.  Avoid getting in the eye.  This is a non-steroid ointment for dermatitis.  Can refrigerate if having a burning sensation.     - continue moisturization with emollients like Cetaphil, Cerave, Vanicream.  Provided with Vanicream samples  Food allergy   - continue avoidance of crustaceans (shrimp, lobster, crab)    - have access to self-injectable epinephrine (Epipen or AuviQ) 0.3mg  at all times    - follow emergency action plan in case of allergic reaction  Asthma   - singulair as above   - have access to albuterol inhaler 2 puffs every 4-6 hours as needed for cough/wheeze/shortness of breath/chest tightness.  May use 15-20 minutes prior to activity.   Monitor frequency of use.    Control goals:   Full participation in all desired activities (may need albuterol before activity)  Albuterol use two time or less a week on average (not counting use with activity)  Cough interfering with sleep two time or less a month  Oral steroids no more than once a year  No  hospitalizations   Follow-up 4-6 months or sooner if needed

## 2021-04-26 ENCOUNTER — Ambulatory Visit: Payer: Self-pay | Admitting: Allergy

## 2023-01-09 ENCOUNTER — Telehealth: Payer: Self-pay | Admitting: Gastroenterology

## 2023-01-09 NOTE — Telephone Encounter (Signed)
Good Afternoon Dr. Bryan Lemma,  Supervising Provider  This patient is wishing to be seen states she is having abdominal pain and would like to possibly discuss a colonoscopy. She's a former Dr. Deatra Ina patient and recently saw Gary last year for an initial consult and hematochezia. Patient is wishing to be seen here due to [place] being closer to where she lives. Records from [facility] are available to view via Care Everywhere. Please review them at your earliest convenience and advise on scheduling.  Thank You!

## 2023-01-11 NOTE — Telephone Encounter (Signed)
Records reviewed.  Was seen by [facility] on 09/02/2021 for evaluation of abdominal pain, bloating, nausea, postprandial reflux, constipation, hematochezia.  Treated with simethicone, Levsin, MiraLAX, fiber, with consideration for PPI trial and hydrogen breath testing.  Ordered colonoscopy (not done).  Still with constipation in November and was prescribed Linzess (has had Linzess prior to that as well).   Prior to that, normal colonoscopy by Dr. Deatra Ina in 02/2009.  Ok to schedule OV for transfer of care.

## 2023-01-11 NOTE — Telephone Encounter (Signed)
Called to schedule, didn't answer- mailbox full

## 2023-02-17 ENCOUNTER — Ambulatory Visit: Payer: Self-pay | Admitting: Gastroenterology

## 2024-08-14 ENCOUNTER — Telehealth: Payer: Self-pay | Admitting: *Deleted

## 2024-08-14 NOTE — Telephone Encounter (Signed)
 Spoke with the patient regarding the referral to GYN oncology. Patient scheduled as new patient with Dr Eldonna on 9/15 at 9 am. Patient given an arrival time of 8:30 am.  Explained to the patient the the doctor will perform a pelvic exam at this visit. Patient given the policy that only one visitor allowed and that visitor must be over 16 yrs are allowed in the Cancer Center. Patient given the address/phone number for the clinic and that the center offers free valet service. Patient aware that masks required.   Patient offered early appt dates but declined the appts. Patient requested to be put on a wait list for cancellations

## 2024-09-04 ENCOUNTER — Encounter: Payer: Self-pay | Admitting: Gynecologic Oncology

## 2024-09-05 ENCOUNTER — Encounter: Payer: Self-pay | Admitting: Gynecologic Oncology

## 2024-09-05 NOTE — Progress Notes (Unsigned)
 GYNECOLOGIC ONCOLOGY NEW PATIENT CONSULTATION   Patient Name: Crystal Stuart  Patient Age: 43 y.o. Date of Service: 09/06/24 Referring Provider: Nena App, MD  Primary Care Provider: Theo Iha, MD Consulting Provider: Comer Dollar, MD   Assessment/Plan:  Premenopausal patient with complex adnexal mass.  We reviewed in detail her recent ultrasounds as well as her symptomatology.  She has some symptoms that are suggestive of endometriosis although given her history of IBS and GI related symptoms, it is hard to distinguish what is related to her GI track and what may be pathology related to gynecologic track or hormonal fluctuations.    I discussed that although I am unable to see images from her ultrasounds this year, the small solid mass in her left ovary does not appear to have features that raise significant concern for borderline process or malignancy.  On her more recent imaging, this area was smaller than it had been in June, which is also reassuring.  She had tumor markers in June that were normal.  I stressed that these are not diagnostic can typically help us  to determine who would be best to do someone surgery once the decision for surgery has been made in the setting of an adnexal mass.  With regard to options moving forward, we discussed several strategies.  The first, would be to repeat a pelvic ultrasound in 3-6 months, either with her OB/GYN's office or here at the hospital (which would allow me to see images).  The second would be to get more definitive imaging now with a pelvic MRI.  I agree with the patient's referring provider that surgery in the setting of this mass is likely overtreatment.  In the setting of her other symptoms, we discussed possibility of endometriosis.  She has previously been on hormones, but it has been sometime since she was on progesterone.  I think it would be reasonable to do a 2-53-month course of low-dose progesterone to see if this helps  with her symptoms of bloating and pelvic pain.    Her maternal grandmother died from ovarian cancer.  The patient has not had genetic testing and I suggested referral to genetics as genetic testing results may impact any decisions about surgery now or in the future.  Patient was open to a referral which was placed today.  I will have a phone visit follow-up with her several weeks after to discuss her results.  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  A copy of this note was sent to the patient's referring provider.   65 minutes of total time was spent for this patient encounter, including preparation, face-to-face counseling with the patient and coordination of care, and documentation of the encounter.  Comer Dollar, MD  Division of Gynecologic Oncology  Department of Obstetrics and Gynecology  [facility]  ___________________________________________  Chief Complaint: Chief Complaint  Patient presents with   Cyst of ovary, unspecified laterality    History of Present Illness:  Crystal Stuart is a 43 y.o. y.o. female who is seen in consultation at the request of Dr. App for an evaluation of a complex adnexal mass.  She initially had pelvic discomfort and bloating. 06/10/2024: Pelvic ultrasound at [facility] shows uterus measuring 8.4 x 4.7 x 4.3 cm with an endometrial lining of 9.6 mm.  Left ovary measures up  to 3.5 cm.  Right ovary measures up to 3.8 cm.  Left ovary noted to have a 1.9 x 1.4 x 1.7 cm cyst.  There is a solid heterogenous nodule with peripheral vascularity described.   Tumor markers on 06/12/24 CEA: <0.6 CA125: 7.4 08/08/2024: Pelvic ultrasound at [facility] solid nodule again seen, overall size of the left ovary now measures up to 2.9 cm with a left ovarian cyst measuring up  to 1.6 cm.  Patient presents today overall doing well.  She notes having a history of chronic abdominal pain which she describes as epigastric.  She underwent GI evaluation, had a colonoscopy last year.  She was treated for gastritis.  After treatment for gastritis, she noticed some pelvic pain that seem to be typically related to her cycles, which prompted her to reach out to her OB/GYN and ultimately led to her ultrasound in June of this year.  She notes a history of bloating, somewhat cyclic now although decreased overall since making some changes to her diet (decrease in meat consumption).  Also describes irregular bowel movements and some constipation with bowel movements typically every 2-4 days.  She sometimes has pain with bowel movements if she is constipated, if she has a larger bowel movement, or around the time of her menstrual cycle.  Has tried MiraLAX, Metamucil, and Benefiber previously which caused significant bloating.  She reports decreased appetite since restarting medication for her ADD.  She was on Adderall, which decreased her appetite significantly.  Currently on Vyvanse, which decreases her appetite less.  In terms of her menstrual cycle, she endorses menses that have become more regular with age.  They last 5-6 days.  She had 1 episode of intermenstrual bleeding around the time of intercourse in April, otherwise denies intermenstrual bleeding.  Has had some clots with her last few cycles.  Had temporary testosterone and progesterone pellets placed previously.  Does not think she had an estrogen pellet placed at this time.  Given development of significant acne, she did not pursue this again.  Subsequently tried sublingual testosterone but side effects caused her to stop this after 3-4 weeks.  PAST MEDICAL HISTORY:  Past Medical History:  Diagnosis Date   Acne    ADD (attention deficit disorder)    Asthma    prn inhaler   Breast mass, right 04/18/2014   Heart murmur     states since age 63; no known problems   History of gastritis    IBS (irritable bowel syndrome)      PAST SURGICAL HISTORY:  Past Surgical History:  Procedure Laterality Date   BREAST BIOPSY Right 05/02/2014   Procedure: EXCISION OF RIGHT BREAST MASS;  Surgeon: Jina Nephew, MD;  Location: [facility];  Service: General;  Laterality: Right;   BREAST ENHANCEMENT SURGERY     CESAREAN SECTION  10/04/2005   DILATION AND CURETTAGE OF UTERUS  08/30/2004   KNEE SURGERY Left 01/2000   arthroscopy    OB/GYN HISTORY:  OB History  Gravida Para Term Preterm AB Living  2 1    1   SAB IAB Ectopic Multiple Live Births          # Outcome Date GA Lbr Len/2nd Weight Sex Type Anes PTL Lv  2 Gravida           1 Para             No LMP recorded.  Age at menarche: 30  Hx of HRT:  Hx of STDs:  remote history of HPV Last pap: 03/2024 - NILM, HR HPV+ (16/18/45 negative) History of abnormal pap smears: yes  SCREENING STUDIES:  Last mammogram: 2025  Last colonoscopy: 2024  MEDICATIONS: Outpatient Encounter Medications as of 09/06/2024  Medication Sig   amphetamine-dextroamphetamine (ADDERALL) 20 MG tablet Take 20 mg by mouth daily.   AUVI-Q  0.3 MG/0.3ML SOAJ injection Use as directed for life-threatening allergic reaction.   COD LIVER OIL PO 1,000 MG daily   hydrOXYzine (ATARAX/VISTARIL) 25 MG tablet Take 25 mg by mouth every 8 (eight) hours as needed.   L-Theanine 200 MG CAPS Take by mouth at bedtime.   levocetirizine (XYZAL) 5 MG tablet every evening.   lisdexamfetamine (VYVANSE) 30 MG capsule Take by mouth daily.   Multiple Vitamins-Minerals (MULTI FOR HER PO)    NP THYROID 15 MG tablet Take 15 mg by mouth every morning.   PROBIOTIC PRODUCT PO Take by mouth.   Vitamin D-Vitamin K (VITAMIN K2-VITAMIN D3 PO) Take by mouth daily. (Patient taking differently: Take by mouth daily. Vitamin D3 1000IU + K2 100mcg)   [DISCONTINUED] albuterol (PROVENTIL HFA;VENTOLIN HFA) 108 (90 BASE)  MCG/ACT inhaler Inhale into the lungs every 6 (six) hours as needed for wheezing or shortness of breath.   [DISCONTINUED] eszopiclone (LUNESTA) 2 MG TABS tablet Take 2 mg by mouth at bedtime as needed for sleep. Take immediately before bedtime   [DISCONTINUED] Magnesium 400 MG TABS Take by mouth daily.   [DISCONTINUED] montelukast (SINGULAIR) 10 MG tablet Take 10 mg by mouth every evening.   [DISCONTINUED] pimecrolimus  (ELIDEL ) 1 % cream Can apply to eyelids twice daily as needed. (Patient not taking: Reported on 12/22/2020)   [DISCONTINUED] triamcinolone ointment (KENALOG) 0.1 % Apply topically.   No facility-administered encounter medications on file as of 09/06/2024.    ALLERGIES:  Allergies  Allergen Reactions   Doxycycline Monohydrate Hives and Shortness Of Breath   Minocycline Shortness Of Breath   Shellfish Allergy Anaphylaxis   Tetracyclines & Related Hives and Shortness Of Breath   Latex Hives   Tizanidine     Possible shortness of breath   Breo Ellipta [Fluticasone Furoate-Vilanterol] Palpitations   Soap Itching    BURNING AND ITCHING/DRYNESS OF SKIN     FAMILY HISTORY:  Family History  Problem Relation Age of Onset   Stroke Mother    Seizures Mother    Stroke Father    Hypertension Father    Wilm's tumor Sister    Hypertension Maternal Grandmother    Stroke Maternal Grandmother    Crohn's disease Maternal Grandmother    High Cholesterol Maternal Grandmother    Diabetes Maternal Grandmother    Ovarian cancer Paternal Grandmother        died age 61   Kidney failure Paternal Grandmother    Allergic rhinitis Son      SOCIAL HISTORY:  Social Connections: Unknown (01/17/2023)   Received from [employer]   Social Network    Social Network: Not on file    REVIEW OF SYSTEMS:  + Intermittent pelvic pain, joint pain, constipation, bloating, pelvic pain, easy bruising/bleeding Denies appetite changes, fevers, chills, fatigue, unexplained weight changes. Denies  hearing loss, neck lumps or masses, mouth sores, ringing in ears or voice changes. Denies cough or wheezing.  Denies shortness of breath. Denies chest pain or palpitations. Denies leg swelling. Denies abdominal pain, blood in stools, diarrhea, nausea, vomiting, or early satiety. Denies pain with intercourse, dysuria, frequency, hematuria or incontinence. Denies hot flashes, vaginal bleeding or vaginal discharge.  Denies back pain or muscle pain/cramps. Denies itching, rash, or wounds. Denies dizziness, headaches, numbness or seizures. Denies swollen lymph nodes or glands. Denies anxiety, depression, confusion, or decreased concentration.  Physical Exam:  Vital Signs for this encounter:  Blood pressure 116/64, pulse 70, temperature 98.9 F (37.2 C), temperature source Oral, resp. rate 19, height 5' 11 (1.803 m), weight 174 lb 3.2 oz (79 kg), SpO2 100%. Body mass index is 24.3 kg/m. General: Alert, oriented, no acute distress.  HEENT: Normocephalic, atraumatic. Sclera anicteric.  Chest: Clear to auscultation bilaterally. No wheezes, rhonchi, or rales. Cardiovascular: Regular rate and rhythm, no murmurs, rubs, or gallops.  Abdomen: Normoactive bowel sounds. Soft, nondistended, nontender to palpation. No masses or hepatosplenomegaly appreciated. No palpable fluid wave.  Well-healed Pfannenstiel incision. Extremities: Grossly normal range of motion. Warm, well perfused. No edema bilaterally.  Skin: No rashes or lesions.  Lymphatics: No cervical, supraclavicular, or inguinal adenopathy.  GU:  Normal external female genitalia. No lesions. No discharge or bleeding.             Bladder/urethra:  No lesions or masses, well supported bladder             Vagina: Well-rugated, some blood within the vaginal vault.             Cervix: Normal appearing, no lesions.             Uterus: Small, mobile, no parametrial involvement or nodularity.             Adnexa: No masses appreciated.  Rectal:  Deferred.  LABORATORY AND RADIOLOGIC DATA:  Outside medical records were reviewed to synthesize the above history, along with the history and physical obtained during the visit.   Lab Results  Component Value Date   WBC 9.6 02/13/2009   HGB 12.1 05/02/2014   HCT 36.0 02/13/2009   PLT 223 02/13/2009   GLUCOSE 94 02/13/2009   ALT 13 02/13/2009   AST 17 02/13/2009   NA 139 02/13/2009   K 3.6 02/13/2009   CL 105 02/13/2009   CREATININE 0.8 02/13/2009   BUN 9 02/13/2009   CO2 29 02/13/2009

## 2024-09-06 ENCOUNTER — Encounter: Payer: Self-pay | Admitting: Gynecologic Oncology

## 2024-09-06 ENCOUNTER — Inpatient Hospital Stay: Payer: PRIVATE HEALTH INSURANCE | Attending: Gynecologic Oncology | Admitting: Gynecologic Oncology

## 2024-09-06 VITALS — BP 116/64 | HR 70 | Temp 98.9°F | Resp 19 | Ht 71.0 in | Wt 174.2 lb

## 2024-09-06 DIAGNOSIS — R102 Pelvic and perineal pain: Secondary | ICD-10-CM

## 2024-09-06 DIAGNOSIS — R109 Unspecified abdominal pain: Secondary | ICD-10-CM | POA: Diagnosis not present

## 2024-09-06 DIAGNOSIS — J45909 Unspecified asthma, uncomplicated: Secondary | ICD-10-CM | POA: Insufficient documentation

## 2024-09-06 DIAGNOSIS — R140 Abdominal distension (gaseous): Secondary | ICD-10-CM

## 2024-09-06 DIAGNOSIS — D398 Neoplasm of uncertain behavior of other specified female genital organs: Secondary | ICD-10-CM | POA: Diagnosis present

## 2024-09-06 DIAGNOSIS — Z79899 Other long term (current) drug therapy: Secondary | ICD-10-CM | POA: Diagnosis not present

## 2024-09-06 DIAGNOSIS — R011 Cardiac murmur, unspecified: Secondary | ICD-10-CM | POA: Insufficient documentation

## 2024-09-06 DIAGNOSIS — F988 Other specified behavioral and emotional disorders with onset usually occurring in childhood and adolescence: Secondary | ICD-10-CM | POA: Insufficient documentation

## 2024-09-06 DIAGNOSIS — G8929 Other chronic pain: Secondary | ICD-10-CM | POA: Insufficient documentation

## 2024-09-06 DIAGNOSIS — N83209 Unspecified ovarian cyst, unspecified side: Secondary | ICD-10-CM

## 2024-09-06 DIAGNOSIS — Z8041 Family history of malignant neoplasm of ovary: Secondary | ICD-10-CM

## 2024-09-06 NOTE — Patient Instructions (Signed)
 It was very nice to meet you today.  I placed a referral to get you into our genetic counselors to discuss genetic testing.  You and I will have a phone visit several weeks after this to discuss the results and any change to the plan we have made.  Although I am not able to see the ultrasound pictures from your OB/GYN's office, given the size and description of the small mass on your ovary, I think the risk that this represents a precancer or cancer is very low.  We discussed options moving forward including repeat ultrasound either at the hospital or with your OB/GYN in 3-6 months, pelvic MRI, or consideration of surgery.  I agree with your OB/GYN that surgery now is likely unnecessary.  I suggested that you discuss trial of low-dose oral progesterone with your OB/GYN.

## 2024-09-10 ENCOUNTER — Encounter: Admitting: Genetic Counselor

## 2024-09-10 ENCOUNTER — Encounter: Payer: Self-pay | Admitting: Obstetrics and Gynecology

## 2024-09-10 ENCOUNTER — Ambulatory Visit: Payer: Self-pay | Admitting: Genetic Counselor

## 2024-09-10 ENCOUNTER — Inpatient Hospital Stay (HOSPITAL_BASED_OUTPATIENT_CLINIC_OR_DEPARTMENT_OTHER): Payer: PRIVATE HEALTH INSURANCE | Admitting: Genetic Counselor

## 2024-09-10 ENCOUNTER — Other Ambulatory Visit

## 2024-09-10 ENCOUNTER — Inpatient Hospital Stay: Payer: PRIVATE HEALTH INSURANCE

## 2024-09-10 ENCOUNTER — Encounter: Payer: Self-pay | Admitting: Genetic Counselor

## 2024-09-10 DIAGNOSIS — Z8041 Family history of malignant neoplasm of ovary: Secondary | ICD-10-CM

## 2024-09-10 NOTE — Progress Notes (Signed)
 REFERRING PROVIDER: Viktoria Comer SAUNDERS, MD [address] [place],  [place] [address]  PRIMARY PROVIDER:  Theo Iha, MD  PRIMARY REASON FOR VISIT:  1. Family history of ovarian cancer      HISTORY OF PRESENT ILLNESS:  I connected with  Crystal Stuart on 09/10/2024 at 11 am EDT by MyChart video conference and verified that I am speaking with the correct person using three identifiers.   Patient location: work Provider location: [facility] office   Crystal Stuart, a 43 y.o. female, was seen for a [facility] cancer genetics consultation at the request of Dr. Viktoria due to a family history of cancer.  Crystal Stuart presents to clinic today to discuss the possibility of a hereditary predisposition to cancer, genetic testing, and to further clarify her future cancer risks, as well as potential cancer risks for family members.   Crystal Stuart is a 43 y.o. female with no personal history of cancer.    CANCER HISTORY:  Oncology History   No history exists.     RISK FACTORS:  Menarche was at age 88.  First live birth at age 28.   Ovaries intact: yes.  Hysterectomy: no.  Menopausal status: premenopausal.  HRT use: 0 years. Colonoscopy: yes; normal. Mammogram within the last year: yes. Number of breast biopsies: 1. Up to date with pelvic exams: yes. Any excessive radiation exposure in the past: no  Past Medical History:  Diagnosis Date   Acne    ADD (attention deficit disorder)    Asthma    prn inhaler   Breast mass, right 04/18/2014   Family history of ovarian cancer    Heart murmur    states since age 42; no known problems   History of gastritis    IBS (irritable bowel syndrome)     Past Surgical History:  Procedure Laterality Date   BREAST BIOPSY Right 05/02/2014   Procedure: EXCISION OF RIGHT BREAST MASS;  Surgeon: Jina Nephew, MD;  Location: [facility];  Service: General;  Laterality: Right;   BREAST ENHANCEMENT SURGERY     CESAREAN SECTION   10/04/2005   DILATION AND CURETTAGE OF UTERUS  08/30/2004   KNEE SURGERY Left 01/2000   arthroscopy    Social History   Socioeconomic History   Marital status: Single    Spouse name: Not on file   Number of children: Not on file   Years of education: Not on file   Highest education level: Not on file  Occupational History   Not on file  Tobacco Use   Smoking status: Never   Smokeless tobacco: Never  Substance and Sexual Activity   Alcohol use: Yes    Comment: occasionally   Drug use: No   Sexual activity: Yes    Comment: Vasectomy  Other Topics Concern   Not on file  Social History Narrative   Not on file   Social Drivers of Health   Financial Resource Strain: Not on file  Food Insecurity: Not on file  Transportation Needs: Not on file  Physical Activity: Not on file  Stress: Not on file  Social Connections: Unknown (01/17/2023)   Received from [facility]   Social Network    Social Network: Not on file     FAMILY HISTORY:  We obtained a detailed, 4-generation family history.  Significant diagnoses are listed below: Family History  Problem Relation Age of Onset   Stroke Mother    Seizures Mother    Stroke Father  Hypertension Father    Wilm's tumor Half-Sister 8       paternal half sister   Other Paternal Aunt        HHT - heredtiary hemorrhagic telangietasia   Hypertension Maternal Grandmother    Stroke Maternal Grandmother    Crohn's disease Maternal Grandmother    High Cholesterol Maternal Grandmother    Diabetes Maternal Grandmother    Ovarian cancer Paternal Grandmother        died age 25   Kidney failure Paternal Grandmother    Allergic rhinitis Son       The patient has one son who is cancer free. She has a maternal half brother and sister and three paternal half brothers and two sisters.  One paternal half sister died of wilms tumor at age 57.  Both parents are living.  The patient's mother is cancer free. She has a paternal half sister  who is cancer free. There is no other reported family history of cancer on this side of the family.  The patient's father is living and cancer free.  He has one full brother and three maternal half sisters.  A half sister has Hereditary Hemorrhagic Telangiectasia (HHT).  The paternal grandmother had ovarian cancer and died at 33.  Crystal Stuart is unaware of previous family history of genetic testing for hereditary cancer risks. There is no reported Ashkenazi Jewish ancestry. There is no known consanguinity.  GENETIC COUNSELING ASSESSMENT: Crystal Stuart is a 43 y.o. female with a family history of cancer which is somewhat suggestive of a hereditary cancer syndrome and predisposition to cancer given the ovarian cancer diagnosis. We, therefore, discussed and recommended the following at today's visit.   DISCUSSION: We discussed that, in general, most cancer is not inherited in families, but instead is sporadic or familial. Sporadic cancers occur by chance and typically happen at older ages (>50 years) as this type of cancer is caused by genetic changes acquired during an individual's lifetime. Some families have more cancers than would be expected by chance; however, the ages or types of cancer are not consistent with a known genetic mutation or known genetic mutations have been ruled out. This type of familial cancer is thought to be due to a combination of multiple genetic, environmental, hormonal, and lifestyle factors. While this combination of factors likely increases the risk of cancer, the exact source of this risk is not currently identifiable or testable.  We discussed that 5 - 10% of cancer is hereditary, with most cases of ovarian cancer associated with BRCA mutations.  There are other genes that can be associated with hereditary ovarian cancer syndromes.  These include BRIP1, RAD51C, RAD51D and Lynch syndrome.  We discussed that testing is beneficial for several reasons including knowing how to  follow individuals after completing their treatment, identifying whether potential treatment options such as PARP inhibitors would be beneficial, and understand if other family members could be at risk for cancer and allow them to undergo genetic testing.   We reviewed the characteristics, features and inheritance patterns of hereditary cancer syndromes. We also discussed genetic testing, including the appropriate family members to test, the process of testing, insurance coverage and turn-around-time for results. We discussed the implications of a negative, positive, carrier and/or variant of uncertain significant result. Crystal Stuart  was offered a common hereditary cancer panel (36+ genes) and an expanded pan-cancer panel (70+ genes). Crystal Stuart was informed of the benefits and limitations of each panel, including that expanded pan-cancer panels contain  genes that do not have clear management guidelines at this point in time.  We also discussed that as the number of genes included on a panel increases, the chances of variants of uncertain significance increases. Crystal Stuart decided to pursue genetic testing for the CancerNext-Expanded+RNAinsight gene panel.   The CancerNext-Expanded gene panel offered by [facility] and includes sequencing, rearrangement, and RNA analysis for the following 77 genes: AIP, ALK, APC, ATM, BAP1, BARD1, BMPR1A, BRCA1, BRCA2, BRIP1, CDC73, CDH1, CDK4, CDKN1B, CDKN2A, CEBPA, CHEK2, CTNNA1, DDX41, DICER1, ETV6, FH, FLCN, GATA2, LZTR1, MAX, MBD4, MEN1, MET, MLH1, MSH2, MSH3, MSH6, MUTYH, NF1, NF2, NTHL1, PALB2, PHOX2B, PMS2, POT1, PRKAR1A, PTCH1, PTEN, RAD51C, RAD51D, RB1, RET, RPS20, RUNX1, SDHA, SDHAF2, SDHB, SDHC, SDHD, SMAD4, SMARCA4, SMARCB1, SMARCE1, STK11, SUFU, TMEM127, TP53, TSC1, TSC2, VHL, and WT1 (sequencing and deletion/duplication); AXIN2, CTNNA1, DDX41, EGFR, HOXB13, KIT, MBD4, MITF, MSH3, PDGFRA, POLD1 and POLE (sequencing only); EPCAM and GREM1 (deletion/duplication  only). RNA data is routinely analyzed for use in variant interpretation for all genes.   Based on Crystal Stuart's family history of cancer, she meets medical criteria for genetic testing. Though Crystal Stuart is not personally affected, there are no affected family members that are willing/able/available to undergo hereditary cancer testing.  Therefore, Crystal Stuart the most informative family member available.  Despite that she meets criteria, she may still have an out of pocket cost. We discussed that if her out of pocket cost for testing is over $100, the laboratory will call and confirm whether she wants to proceed with testing.  If the out of pocket cost of testing is less than $100 she will be billed by the genetic testing laboratory.   We discussed that some people do not want to undergo genetic testing due to fear of genetic discrimination.  The Genetic Information Nondiscrimination Act (GINA) was signed into federal law in 2008. GINA prohibits health insurers and most employers from discriminating against individuals based on genetic information (including the results of genetic tests and family history information). According to GINA, health insurance companies cannot consider genetic information to be a preexisting condition, nor can they use it to make decisions regarding coverage or rates. GINA also makes it illegal for most employers to use genetic information in making decisions about hiring, firing, promotion, or terms of employment. It is important to note that GINA does not offer protections for life insurance, disability insurance, or long-term care insurance. GINA does not apply to those in the [employer], those who work for companies with less than 15 employees, and new life insurance or long-term disability insurance policies.  Health status due to a cancer diagnosis is not protected under GINA. More information about GINA can be found by visiting [email or web address].  PLAN: After considering the  risks, benefits, and limitations, Crystal Stuart provided informed consent to pursue genetic testing and the blood sample was sent to [facility] for analysis of the CancerNext-Expanded+RNAinsight. Results should be available within approximately 2-3 weeks' time, at which point they will be disclosed by telephone to Crystal Stuart, as will any additional recommendations warranted by these results. Crystal Stuart will receive a summary of her genetic counseling visit and a copy of her results once available. This information will also be available in Epic.   Lastly, we encouraged Crystal Stuart to remain in contact with cancer genetics annually so that we can continuously update the family history and inform her of any changes in cancer genetics and testing that may be of benefit for this family.  Crystal Stuart questions were answered to her satisfaction today. Our contact information was provided should additional questions or concerns arise. Thank you for the referral and allowing us  to share in the care of your patient.   Taytum Scheck P. Perri, MS, CGC Licensed, Patent attorney [email or web address] phone: [phone]  In total, 60 minutes were spent on the date of the encounter in service to the patient including preparation, face-to-face consultation, documentation and care coordination.  The patient was seen alone.  Drs. Lanny Stalls, and/or Gudena were available for questions, if needed..    _______________________________________________________________________ For Office Staff:  Number of people involved in session: 1 Was an Intern/ student involved with case: no

## 2024-09-11 ENCOUNTER — Other Ambulatory Visit

## 2024-09-11 ENCOUNTER — Encounter: Admitting: Genetic Counselor

## 2024-09-12 ENCOUNTER — Inpatient Hospital Stay: Payer: PRIVATE HEALTH INSURANCE

## 2024-09-12 DIAGNOSIS — Z8041 Family history of malignant neoplasm of ovary: Secondary | ICD-10-CM

## 2024-09-12 LAB — GENETIC SCREENING ORDER

## 2024-09-25 ENCOUNTER — Encounter: Payer: Self-pay | Admitting: Genetic Counselor

## 2024-09-25 DIAGNOSIS — Z1379 Encounter for other screening for genetic and chromosomal anomalies: Secondary | ICD-10-CM | POA: Insufficient documentation

## 2024-09-26 ENCOUNTER — Ambulatory Visit: Payer: Self-pay | Admitting: Genetic Counselor

## 2024-09-26 ENCOUNTER — Other Ambulatory Visit: Payer: Self-pay | Admitting: Internal Medicine

## 2024-09-26 ENCOUNTER — Telehealth: Payer: Self-pay | Admitting: Genetic Counselor

## 2024-09-26 DIAGNOSIS — N1831 Chronic kidney disease, stage 3a: Secondary | ICD-10-CM

## 2024-09-26 DIAGNOSIS — Z1379 Encounter for other screening for genetic and chromosomal anomalies: Secondary | ICD-10-CM

## 2024-09-26 NOTE — Progress Notes (Signed)
 HPI:  Crystal Stuart was previously seen in the [facility] due to a family history of cancer and concerns regarding a hereditary predisposition to cancer. Please refer to our prior cancer genetics clinic note for more information regarding our discussion, assessment and recommendations, at the time. Crystal Stuart recent genetic test results were disclosed to her, as were recommendations warranted by these results. These results and recommendations are discussed in more detail below.  CANCER HISTORY:  Oncology History   No history exists.    FAMILY HISTORY:  We obtained a detailed, 4-generation family history.  Significant diagnoses are listed below: Family History  Problem Relation Age of Onset   Stroke Mother    Seizures Mother    Stroke Father    Hypertension Father    Wilm's tumor Half-Sister 8       paternal half sister   Other Paternal Aunt        HHT - heredtiary hemorrhagic telangietasia   Hypertension Maternal Grandmother    Stroke Maternal Grandmother    Crohn's disease Maternal Grandmother    High Cholesterol Maternal Grandmother    Diabetes Maternal Grandmother    Ovarian cancer Paternal Grandmother        died age 56   Kidney failure Paternal Grandmother    Allergic rhinitis Son          The patient has one son who is cancer free. She has a maternal half brother and sister and three paternal half brothers and two sisters.  One paternal half sister died of wilms tumor at age 62.  Both parents are living.   The patient's mother is cancer free. She has a paternal half sister who is cancer free. There is no other reported family history of cancer on this side of the family.   The patient's father is living and cancer free.  He has one full brother and three maternal half sisters.  A half sister has Hereditary Hemorrhagic Telangiectasia (HHT).  The paternal grandmother had ovarian cancer and died at 24.   Crystal Stuart is unaware of previous family  history of genetic testing for hereditary cancer risks. There is no reported Ashkenazi Jewish ancestry. There is no known consanguinity  GENETIC TEST RESULTS: Genetic testing reported out on September 24, 2024 through the CancerNext-Expanded+RNAinsight cancer panel found no pathogenic mutations. The CancerNext-Expanded gene panel offered by [facility] and includes sequencing, rearrangement, and RNA analysis for the following 77 genes: AIP, ALK, APC, ATM, BAP1, BARD1, BMPR1A, BRCA1, BRCA2, BRIP1, CDC73, CDH1, CDK4, CDKN1B, CDKN2A, CEBPA, CHEK2, CTNNA1, DDX41, DICER1, ETV6, FH, FLCN, GATA2, LZTR1, MAX, MBD4, MEN1, MET, MLH1, MSH2, MSH3, MSH6, MUTYH, NF1, NF2, NTHL1, PALB2, PHOX2B, PMS2, POT1, PRKAR1A, PTCH1, PTEN, RAD51C, RAD51D, RB1, RET, RPS20, RUNX1, SDHA, SDHAF2, SDHB, SDHC, SDHD, SMAD4, SMARCA4, SMARCB1, SMARCE1, STK11, SUFU, TMEM127, TP53, TSC1, TSC2, VHL, and WT1 (sequencing and deletion/duplication); AXIN2, CTNNA1, DDX41, EGFR, HOXB13, KIT, MBD4, MITF, MSH3, PDGFRA, POLD1 and POLE (sequencing only); EPCAM and GREM1 (deletion/duplication only). RNA data is routinely analyzed for use in variant interpretation for all genes. The test report has been scanned into EPIC and is located under the Molecular Pathology section of the Results Review tab.  A portion of the result report is included below for reference.     We discussed with Crystal Stuart that because current genetic testing is not perfect, it is possible there may be a gene mutation in one of these genes that current testing cannot detect, but that chance is  small.  We also discussed, that there could be another gene that has not yet been discovered, or that we have not yet tested, that is responsible for the cancer diagnoses in the family. It is also possible there is a hereditary cause for the cancer in the family that Crystal Stuart did not inherit and therefore was not identified in her testing.  Therefore, it is important to remain in touch with  cancer genetics in the future so that we can continue to offer Crystal Stuart the most up to date genetic testing.   ADDITIONAL GENETIC TESTING: We discussed with Crystal Stuart that her genetic testing was fairly extensive.  If there are genes identified to increase cancer risk that can be analyzed in the future, we would be happy to discuss and coordinate this testing at that time.    CANCER SCREENING RECOMMENDATIONS: Crystal Stuart's test result is considered negative (normal).  This means that we have not identified a hereditary cause for her family history of cancer at this time. Most cancers happen by chance and this negative test suggests that her family history of cancer may fall into this category.    Possible reasons for Crystal Stuart's negative genetic test include:  1. There may be a gene mutation in one of these genes that current testing methods cannot detect but that chance is small.  2. There could be another gene that has not yet been discovered, or that we have not yet tested, that is responsible for the cancer diagnoses in the family.  3.  There may be no hereditary risk for cancer in the family. The cancers in Crystal Stuart and/or her family may be sporadic/familial or due to other genetic and environmental factors. 4. It is also possible there is a hereditary cause for the cancer in the family that Crystal Stuart did not inherit.  Therefore, it is recommended she continue to follow the cancer management and screening guidelines provided by her primary healthcare provider. An individual's cancer risk and medical management are not determined by genetic test results alone. Overall cancer risk assessment incorporates additional factors, including personal medical history, family history, and any available genetic information that may result in a personalized plan for cancer prevention and surveillance  RECOMMENDATIONS FOR FAMILY MEMBERS:   Since she did not inherit a identifiable mutation in a  cancer predisposition gene included on this panel, her children could not have inherited a known mutation from her in one of these genes. Individuals in this family might be at some increased risk of developing cancer, over the general population risk, simply due to the family history of cancer.  We recommended women in this family have a yearly mammogram beginning at age 33, or 79 years younger than the earliest onset of cancer, an annual clinical breast exam, and perform monthly breast self-exams. Women in this family should also have a gynecological exam as recommended by their primary provider. All family members should be referred for colonoscopy starting at age 20, or 69 years younger than the earliest onset of cancer.  FOLLOW-UP: Lastly, we discussed with Crystal Stuart that cancer genetics is a rapidly advancing field and it is possible that new genetic tests will be appropriate for her and/or her family members in the future. We encouraged her to remain in contact with cancer genetics on an annual basis so we can update her personal and family histories and let her know of advances in cancer genetics that may benefit this family.   Our  contact number was provided. Crystal Stuart questions were answered to her satisfaction, and she knows she is welcome to call us  at anytime with additional questions or concerns.   Darice Monte, MS, [facility] Licensed, Certified Genetic Counselor [email or web address]

## 2024-09-26 NOTE — Telephone Encounter (Signed)
 I contacted  Crystal Stuart to discuss her genetic testing results. No pathogenic variants were identified in the 77 genes analyzed. Discussed that we do not know why she has cancer in the family. It could be due to a different gene that we are not testing, or maybe our current technology may not be able to pick something up.  It will be important for her to keep in contact with genetics to keep up with whether additional testing may be needed.Detailed clinic note to follow.   The test report will be scanned into EPIC and will be located under the Molecular Pathology section of the Results Review tab.  A portion of the result report is included below for reference.

## 2024-09-27 ENCOUNTER — Ambulatory Visit
Admission: RE | Admit: 2024-09-27 | Discharge: 2024-09-27 | Disposition: A | Discharge status: Home / Self Care | Source: Ambulatory Visit | Attending: Internal Medicine | Admitting: Internal Medicine

## 2024-09-27 DIAGNOSIS — N1831 Chronic kidney disease, stage 3a: Secondary | ICD-10-CM

## 2024-10-02 ENCOUNTER — Encounter: Payer: Self-pay | Admitting: Gynecologic Oncology

## 2024-10-02 ENCOUNTER — Telehealth: Payer: Self-pay | Admitting: *Deleted

## 2024-10-02 ENCOUNTER — Inpatient Hospital Stay: Payer: PRIVATE HEALTH INSURANCE | Attending: Gynecologic Oncology | Admitting: Gynecologic Oncology

## 2024-10-02 DIAGNOSIS — N83202 Unspecified ovarian cyst, left side: Secondary | ICD-10-CM | POA: Diagnosis not present

## 2024-10-02 DIAGNOSIS — Z7189 Other specified counseling: Secondary | ICD-10-CM | POA: Diagnosis not present

## 2024-10-02 DIAGNOSIS — R102 Pelvic and perineal pain unspecified side: Secondary | ICD-10-CM

## 2024-10-02 DIAGNOSIS — N83209 Unspecified ovarian cyst, unspecified side: Secondary | ICD-10-CM

## 2024-10-02 NOTE — Progress Notes (Signed)
 Gynecologic Oncology Telehealth Note: Gyn-Onc  I connected with Crystal Stuart on 10/02/24 at  6:00 PM EDT by telephone and verified that I am speaking with the correct person using two identifiers.  I discussed the limitations, risks, security and privacy concerns of performing an evaluation and management service by telemedicine and the availability of in-person appointments. I also discussed with the patient that there may be a patient responsible charge related to this service. The patient expressed understanding and agreed to proceed.  Other persons participating in the visit and their role in the encounter: none.  Patient's location: [place] Provider's location: [facility], [place]  Reason for Visit: follow-up  Treatment History: She initially had pelvic discomfort and bloating. 06/10/2024: [facility] shows uterus measuring 8.4 x 4.7 x 4.3 cm with an endometrial lining of 9.6 mm.  Left ovary measures up to 3.5 cm.  Right ovary measures up to 3.8 cm.  Left ovary noted to have a 1.9 x 1.4 x 1.7 cm cyst.  There is a solid heterogenous nodule with peripheral vascularity described.   Tumor markers on 06/12/24 CEA: <0.6 CA125: 7.4 08/08/2024: Pelvic ultrasound at [facility] solid nodule again seen, overall size of the left ovary now measures up to 2.9 cm with a left ovarian cyst measuring up to 1.6 cm.  Interval History: Doing well. Still a little worried about what this mass in the ovary could be.  Past Medical/Surgical History: Past Medical History:  Diagnosis Date   Acne    ADD (attention deficit disorder)    Asthma    prn inhaler   Breast mass, right 04/18/2014   Family history of ovarian cancer    Heart murmur    states since age 43; no known problems   History of gastritis    IBS (irritable bowel syndrome)     Past Surgical History:  Procedure Laterality Date   BREAST BIOPSY Right 05/02/2014   Procedure: EXCISION OF RIGHT BREAST MASS;  Surgeon: Jina Nephew, MD;  Location: [facility];  Service: General;  Laterality: Right;   BREAST ENHANCEMENT SURGERY     CESAREAN SECTION  10/04/2005   DILATION AND CURETTAGE OF UTERUS  08/30/2004   KNEE SURGERY Left 01/2000   arthroscopy    Family History  Problem Relation Age of Onset   Stroke Mother    Seizures Mother    Stroke Father    Hypertension Father    Wilm's tumor Half-Sister 8       paternal half sister   Other Paternal Aunt        HHT - heredtiary hemorrhagic telangietasia   Hypertension Maternal Grandmother    Stroke Maternal Grandmother    Crohn's disease Maternal Grandmother    High Cholesterol Maternal Grandmother    Diabetes Maternal Grandmother    Ovarian cancer Paternal Grandmother        died age 57   Kidney failure Paternal Grandmother    Allergic rhinitis Son     Social History   Socioeconomic History   Marital status: Single    Spouse name: Not on file   Number of children: Not on file   Years of education: Not on file   Highest education level: Not on file  Occupational History   Not on file  Tobacco Use   Smoking status: Never   Smokeless tobacco: Never  Substance and Sexual Activity   Alcohol use: Yes    Comment: occasionally   Drug use: No  Sexual activity: Yes    Comment: Vasectomy  Other Topics Concern   Not on file  Social History Narrative   Not on file   Social Drivers of Health   Financial Resource Strain: Not on file  Food Insecurity: Not on file  Transportation Needs: Not on file  Physical Activity: Not on file  Stress: Not on file  Social Connections: Unknown (01/17/2023)   Received from [facility]   Social Network    Social Network: Not on file    Current Medications:  Current Outpatient Medications:    amphetamine-dextroamphetamine (ADDERALL) 20 MG tablet, Take 20 mg by mouth daily., Disp: , Rfl:    AUVI-Q  0.3 MG/0.3ML SOAJ injection, Use as directed for life-threatening allergic reaction., Disp: 4 Device,  Rfl: 1   COD LIVER OIL PO, 1,000 MG daily, Disp: , Rfl:    hydrOXYzine (ATARAX/VISTARIL) 25 MG tablet, Take 25 mg by mouth every 8 (eight) hours as needed., Disp: , Rfl: 1   L-Theanine 200 MG CAPS, Take by mouth at bedtime., Disp: , Rfl:    levocetirizine (XYZAL) 5 MG tablet, every evening., Disp: , Rfl: 2   lisdexamfetamine (VYVANSE) 30 MG capsule, Take by mouth daily., Disp: , Rfl:    Multiple Vitamins-Minerals (MULTI FOR HER PO), , Disp: , Rfl:    NP THYROID 15 MG tablet, Take 15 mg by mouth every morning., Disp: , Rfl:    PROBIOTIC PRODUCT PO, Take by mouth., Disp: , Rfl:    Vitamin D-Vitamin K (VITAMIN K2-VITAMIN D3 PO), Take by mouth daily. (Patient taking differently: Take by mouth daily. Vitamin D3 1000IU + K2 100mcg), Disp: , Rfl:   Review of Symptoms: Pertinent positives as per HPI.  Physical Exam: Deferred given limitations of phone visit.  Laboratory & Radiologic Studies: Germline genetic testing negative for a pathogenic mutation  Assessment & Plan: Crystal Stuart is a 43 y.o. woman with a complex adnexal mass.   Reviewed recent genetic testing results which were negative for a pathogenic mutation.  She is happy with this news but still voices some concern about what her ovarian mass is, trying to balance the risks of surgery with the worry about continued monitoring and how long she will need follow-up for this.  Discussed options that we had reviewed in clinic again and ultimately I suggested we get a pelvic MRI for better characterization of the ovaries.  This may help provide significant reassurance or may push us  towards consideration of surgery.  Patient amenable.  Discussed process for placing the order and getting it scheduled which starts the prior authorization period.  Also encouraged her to reach out to her insurance company for questions about cost and co-pay.  I discussed the assessment and treatment plan with the patient. The patient was provided with an  opportunity to ask questions and all were answered. The patient agreed with the plan and demonstrated an understanding of the instructions.   The patient was advised to call back or see an in-person evaluation if the symptoms worsen or if the condition fails to improve as anticipated.   10 minutes of total time was spent for this patient encounter, including preparation, phone counseling with the patient and coordination of care, and documentation of the encounter.   Comer Dollar, MD  Division of Gynecologic Oncology  Department of Obstetrics and Gynecology  [facility] [place]  [facility]

## 2024-10-02 NOTE — Telephone Encounter (Signed)
 Spoke with the patient and gave appt date/time for MRI . Patient given central scheduling number to reschedule the scan.

## 2024-10-04 ENCOUNTER — Telehealth: Payer: Self-pay | Admitting: *Deleted

## 2024-10-04 NOTE — Telephone Encounter (Signed)
 Spoke with patient who called about her MRI being scheduled. Pt tried to call radiology today to see about an appt. Today and was told it has not been authorized, apparently her insurance is not listed correctly. Advised patient to send her correct insurance information through MyChart so that we may send her MRI for authorization and then she should be able to schedule it through radiology scheduling. Pt verbally upset about the process and states she has been told several different things? Pt states she provided her insurance card to registration on her visit appt. With Dr. Viktoria on 09/06/24. Patient states she will send her insurance information through MyChart today. Office sympathized with patient's frustration and advised once we have her information corrected she should be able to schedule her MRI without difficulty. Pt thanked the office.

## 2024-10-07 ENCOUNTER — Ambulatory Visit (HOSPITAL_COMMUNITY)

## 2024-11-13 ENCOUNTER — Encounter: Payer: Self-pay | Admitting: Gynecologic Oncology

## 2024-11-15 ENCOUNTER — Telehealth: Payer: Self-pay

## 2024-11-15 NOTE — Telephone Encounter (Signed)
 I reached out to Crystal Stuart regarding her MyChart message sent to our office, on 11/26, about getting an MRI scheduled. According to a previous conversation between Crystal Stuart and Crystal BROCKS RN. At that time, pt was going to send an updated insurance card so the MRI can be authorized and scheduled.   Today, Crystal Stuart states the insurance is correct, except she no longer has Medicaid as secondary, stating I haven't had that in years. She states the primary insurance plan may be different than what is on file. She will send a copy through Mychart. She also asked for the billing number to inquire about other bills she has been receiving. Advised she could let billing know her current insurance information and it would be updated while she was speaking to them. She proceeded to say she would just send a copy of the card through MyChart.

## 2024-11-18 ENCOUNTER — Telehealth: Payer: Self-pay | Admitting: *Deleted

## 2024-11-18 NOTE — Telephone Encounter (Signed)
 Patient called and has new insurance card in the system. Patient wants MRI champ ran again with [facility]

## 2024-11-21 NOTE — Telephone Encounter (Signed)
 Patient MRI scheduled for 12/10 and follow up phone visit on 12/17. Patient aware

## 2024-11-27 ENCOUNTER — Ambulatory Visit (HOSPITAL_COMMUNITY)
Admission: RE | Admit: 2024-11-27 | Discharge: 2024-11-27 | Disposition: A | Discharge status: Home / Self Care | Payer: PRIVATE HEALTH INSURANCE | Source: Ambulatory Visit | Attending: Gynecologic Oncology | Admitting: Gynecologic Oncology

## 2024-11-27 DIAGNOSIS — N83209 Unspecified ovarian cyst, unspecified side: Secondary | ICD-10-CM | POA: Insufficient documentation

## 2024-11-27 DIAGNOSIS — R102 Pelvic and perineal pain unspecified side: Secondary | ICD-10-CM | POA: Insufficient documentation

## 2024-11-27 MED ORDER — GADOBUTROL 1 MMOL/ML IV SOLN
8.0000 mL | Freq: Once | INTRAVENOUS | Status: AC | PRN
  Administered 2024-11-27: 8 mL via INTRAVENOUS

## 2024-11-28 ENCOUNTER — Ambulatory Visit: Payer: Self-pay | Admitting: Gynecologic Oncology

## 2024-12-04 ENCOUNTER — Inpatient Hospital Stay: Payer: PRIVATE HEALTH INSURANCE | Attending: Gynecologic Oncology | Admitting: Gynecologic Oncology

## 2024-12-04 ENCOUNTER — Encounter: Payer: Self-pay | Admitting: Gynecologic Oncology

## 2024-12-04 DIAGNOSIS — Z7189 Other specified counseling: Secondary | ICD-10-CM

## 2024-12-04 DIAGNOSIS — N83209 Unspecified ovarian cyst, unspecified side: Secondary | ICD-10-CM

## 2024-12-04 NOTE — Progress Notes (Signed)
 Gynecologic Oncology Telehealth Note: Gyn-Onc  I connected with Crystal Stuart on 12/04/2024 at  6:00 PM EST by telephone and verified that I am speaking with the correct person using two identifiers.  I discussed the limitations, risks, security and privacy concerns of performing an evaluation and management service by telemedicine and the availability of in-person appointments. I also discussed with the patient that there may be a patient responsible charge related to this service. The patient expressed understanding and agreed to proceed.  Other persons participating in the visit and their role in the encounter: none.  Patient's location: home, [place] Provider's location: [facility], [place]  Reason for Visit: follow-up  Treatment History: She initially had pelvic discomfort and bloating. 06/10/2024: [facility] shows uterus measuring 8.4 x 4.7 x 4.3 cm with an endometrial lining of 9.6 mm.  Left ovary measures up to 3.5 cm.  Right ovary measures up to 3.8 cm.  Left ovary noted to have a 1.9 x 1.4 x 1.7 cm cyst.  There is a solid heterogenous nodule with peripheral vascularity described.   Tumor markers on 06/12/24 CEA: <0.6 CA125: 7.4 08/08/2024: Pelvic [facility] at [facility] solid nodule again seen, overall size of the left ovary now measures up to 2.9 cm with a left ovarian cyst measuring up to 1.6 cm.  09/26/24: Germline genetic testing negative  Interval History: Doing well.  Past Medical/Surgical History: Past Medical History:  Diagnosis Date   Acne    ADD (attention deficit disorder)    Asthma    prn inhaler   Breast mass, right 04/18/2014   Family history of ovarian cancer    Heart murmur    states since age 3; no known problems   History of gastritis    IBS (irritable bowel syndrome)     Past Surgical History:  Procedure Laterality Date   BREAST BIOPSY Right 05/02/2014   Procedure: EXCISION OF RIGHT BREAST MASS;  Surgeon: Jina Nephew, MD;   Location: [facility];  Service: General;  Laterality: Right;   BREAST ENHANCEMENT SURGERY     CESAREAN SECTION  10/04/2005   DILATION AND CURETTAGE OF UTERUS  08/30/2004   KNEE SURGERY Left 01/2000   arthroscopy    Family History  Problem Relation Age of Onset   Stroke Mother    Seizures Mother    Stroke Father    Hypertension Father    Wilm's tumor Half-Sister 8       paternal half sister   Other Paternal Aunt        HHT - heredtiary hemorrhagic telangietasia   Hypertension Maternal Grandmother    Stroke Maternal Grandmother    Crohn's disease Maternal Grandmother    High Cholesterol Maternal Grandmother    Diabetes Maternal Grandmother    Ovarian cancer Paternal Grandmother        died age 58   Kidney failure Paternal Grandmother    Allergic rhinitis Son     Social History   Socioeconomic History   Marital status: Single    Spouse name: Not on file   Number of children: Not on file   Years of education: Not on file   Highest education level: Not on file  Occupational History   Not on file  Tobacco Use   Smoking status: Never   Smokeless tobacco: Never  Substance and Sexual Activity   Alcohol use: Yes    Comment: occasionally   Drug use: No   Sexual activity: Yes  Comment: Vasectomy  Other Topics Concern   Not on file  Social History Narrative   Not on file   Social Drivers of Health   Tobacco Use: Low Risk (10/02/2024)   Patient History    Smoking Tobacco Use: Never    Smokeless Tobacco Use: Never    Passive Exposure: Not on file  Financial Resource Strain: Not on file  Food Insecurity: Not on file  Transportation Needs: Not on file  Physical Activity: Not on file  Stress: Not on file  Social Connections: Unknown (01/17/2023)   Received from [facility]   Social Network    Social Network: Not on file  Depression (PHQ2-9): Not on file  Alcohol Screen: Not on file  Housing: Not on file  Utilities: Not on file  Health Literacy:  Not on file    Current Medications: Current Medications[1]  Review of Symptoms: Pertinent positives as per HPI.  Physical Exam: Deferred given limitations of phone visit.  Laboratory & Radiologic Studies: 11/27/24: Pelvic MRI 1. No suspicious ovarian lesion. There is a dominant follicle and a corpus luteal cyst in the left ovary. 2. There is asymmetric focal thickening of the junctional zone of the posterior uterine body measuring up to 1.5 cm. Findings are concerning for focal adenomyosis. 3. Trace amount of fluid surrounding the left ovary as well as in the dependent pelvis, likely physiological in the patient of this age group. No walled-off abscess or loculated collection.  Assessment & Plan: Crystal Stuart is a 43 y.o. woman with a complex adnexal mass.    Patient overall doing well.  Continues to have some mild left-sided abdominal/pelvic pain about 10 days prior to her menses.  Reviewed MRI results, which are overall very reassuring.  Discussed appearance of the left ovary as well as findings that are suggestive of at least focal adenomyosis.  All questions answered.  I think it would be reasonable to repeat an ultrasound in 6-9 months and if this is reassuring, no follow-up surveillance imaging needed.  If the patient were to have new and concerning symptoms, this should prompt repeat imaging prior.  I discussed the assessment and treatment plan with the patient. The patient was provided with an opportunity to ask questions and all were answered. The patient agreed with the plan and demonstrated an understanding of the instructions.   The patient was advised to call back or see an in-person evaluation if the symptoms worsen or if the condition fails to improve as anticipated.   12 minutes of total time was spent for this patient encounter, including preparation, phone counseling with the patient and coordination of care, and documentation of the encounter.   Comer Dollar, MD  Division of Gynecologic Oncology  Department of Obstetrics and Gynecology  [facility]      [1]  Current Outpatient Medications:    amphetamine-dextroamphetamine (ADDERALL) 20 MG tablet, Take 20 mg by mouth daily., Disp: , Rfl:    AUVI-Q  0.3 MG/0.3ML SOAJ injection, Use as directed for life-threatening allergic reaction., Disp: 4 Device, Rfl: 1   COD LIVER OIL PO, 1,000 MG daily, Disp: , Rfl:    hydrOXYzine (ATARAX/VISTARIL) 25 MG tablet, Take 25 mg by mouth every 8 (eight) hours as needed., Disp: , Rfl: 1   L-Theanine 200 MG CAPS, Take by mouth at bedtime., Disp: , Rfl:    levocetirizine (XYZAL) 5 MG tablet, every evening., Disp: , Rfl: 2   lisdexamfetamine (VYVANSE) 30 MG capsule, Take by mouth daily.,  Disp: , Rfl:    Multiple Vitamins-Minerals (MULTI FOR HER PO), , Disp: , Rfl:    NP THYROID 15 MG tablet, Take 15 mg by mouth every morning., Disp: , Rfl:    PROBIOTIC PRODUCT PO, Take by mouth., Disp: , Rfl:    Vitamin D-Vitamin K (VITAMIN K2-VITAMIN D3 PO), Take by mouth daily. (Patient taking differently: Take by mouth daily. Vitamin D3 1000IU + K2 100mcg), Disp: , Rfl:
